# Patient Record
Sex: Female | Born: 1970 | Hispanic: Yes | Smoking: Former smoker
Health system: Southern US, Community
[De-identification: ages and names within clinical notes are randomized; demographics above are authoritative.]

## PROBLEM LIST (undated history)

## (undated) DIAGNOSIS — A048 Other specified bacterial intestinal infections: Secondary | ICD-10-CM

## (undated) DIAGNOSIS — J45909 Unspecified asthma, uncomplicated: Secondary | ICD-10-CM

## (undated) DIAGNOSIS — E079 Disorder of thyroid, unspecified: Secondary | ICD-10-CM

## (undated) HISTORY — PX: ADENOIDECTOMY: SUR15

## (undated) HISTORY — PX: TONSILLECTOMY: SUR1361

## (undated) HISTORY — DX: Unspecified asthma, uncomplicated: J45.909

---

## 2011-06-04 DIAGNOSIS — K589 Irritable bowel syndrome without diarrhea: Secondary | ICD-10-CM | POA: Insufficient documentation

## 2011-06-04 DIAGNOSIS — G4700 Insomnia, unspecified: Secondary | ICD-10-CM | POA: Insufficient documentation

## 2012-01-04 DIAGNOSIS — M5126 Other intervertebral disc displacement, lumbar region: Secondary | ICD-10-CM | POA: Insufficient documentation

## 2013-06-25 DIAGNOSIS — F32A Depression, unspecified: Secondary | ICD-10-CM | POA: Insufficient documentation

## 2014-01-12 DIAGNOSIS — F419 Anxiety disorder, unspecified: Secondary | ICD-10-CM | POA: Insufficient documentation

## 2015-12-14 DIAGNOSIS — R922 Inconclusive mammogram: Secondary | ICD-10-CM | POA: Insufficient documentation

## 2016-02-02 DIAGNOSIS — Z9189 Other specified personal risk factors, not elsewhere classified: Secondary | ICD-10-CM | POA: Insufficient documentation

## 2016-02-06 DIAGNOSIS — Z1379 Encounter for other screening for genetic and chromosomal anomalies: Secondary | ICD-10-CM | POA: Insufficient documentation

## 2016-06-18 DIAGNOSIS — N6092 Unspecified benign mammary dysplasia of left breast: Secondary | ICD-10-CM | POA: Insufficient documentation

## 2020-07-24 ENCOUNTER — Ambulatory Visit (HOSPITAL_COMMUNITY)
Admission: EM | Admit: 2020-07-24 | Discharge: 2020-07-24 | Disposition: A | Discharge status: Home / Self Care | Payer: No Typology Code available for payment source | Attending: Student | Admitting: Student

## 2020-07-24 ENCOUNTER — Encounter (HOSPITAL_COMMUNITY): Payer: Self-pay

## 2020-07-24 DIAGNOSIS — A048 Other specified bacterial intestinal infections: Secondary | ICD-10-CM

## 2020-07-24 DIAGNOSIS — R109 Unspecified abdominal pain: Secondary | ICD-10-CM

## 2020-07-24 LAB — POCT URINALYSIS DIPSTICK, ED / UC
Bilirubin Urine: NEGATIVE
Glucose, UA: NEGATIVE mg/dL
Hgb urine dipstick: NEGATIVE
Ketones, ur: NEGATIVE mg/dL
Leukocytes,Ua: NEGATIVE
Nitrite: NEGATIVE
Protein, ur: NEGATIVE mg/dL
Specific Gravity, Urine: 1.02 (ref 1.005–1.030)
Urobilinogen, UA: 0.2 mg/dL (ref 0.0–1.0)
pH: 6 (ref 5.0–8.0)

## 2020-07-24 NOTE — ED Triage Notes (Signed)
Pt present abdominal pain with brown urine. Symptoms started two weeks ago. Pt states her lab work from her PCP are abnormal. Pt states her potassium, glucose and Albumin  Levels are increased.

## 2020-07-24 NOTE — Discharge Instructions (Addendum)
-  The amount of abdominal pain you are having is unusual and extreme.  I am concerned that you are having an issue with your internal organs like internal bleeding.  Please head straight to [facility] emergency department for further evaluation and management of this pain.  If you experience worsening symptoms on the way or new symptoms like dizziness, chest pain, shortness of breath-stop and call 911 immediately.

## 2020-07-24 NOTE — ED Triage Notes (Signed)
Placed Pulse Ox on pt in lobby to evaluate HR.

## 2020-07-24 NOTE — ED Provider Notes (Signed)
[facility]    CSN: [id number] Arrival date & time: 07/24/20  1547      History   Chief Complaint Chief Complaint  Patient presents with  . Abdominal Pain  . Urinary Frequency    HPI Stefanie Glass is a 50 y.o. female presenting with abdominal pain.  Medical history atherosclerotic heart disease, IBS.  Self-reported history of H. pylori that she was recently treated for with antibiotic regimen.  This patient initially told nurse that she is having brown urine and abdominal pain for 2 weeks.  However, when I entered the room she told me that she is not having urinary symptoms, but is having diarrhea and left upper quadrant pain. Pain does not radiate to back. Last BM was today and was diarrhea.  She is concerned because she states that her potassium, glucose, albumin levels checked by primary care 1 week ago are abnormal.  States she was recently treated for H. pylori with antibiotic regimen and she is concerned that this has not resolved her symptoms.  States she has diarrhea that leaves yellow residue when she flushes the toilet.  Denies URI symptoms, fever/chills, hematemesis, melena, hematochezia. Denies hematuria, dysuria, frequency, urgency, back pain, n/v/d/abd pain, fevers/chills, abdnormal vaginal discharge. She is postmenopausal. I do not have records of recent PCP visit and labwork.  HPI  History reviewed. No pertinent past medical history.  There are no problems to display for this patient.    OB History   No obstetric history on file.      Home Medications    Prior to Admission medications   Not on File    Family History History reviewed. No pertinent family history.  Social History     Allergies   Sulfa antibiotics   Review of Systems Review of Systems  Constitutional: Negative for appetite change, chills, diaphoresis, fever and unexpected weight change.  HENT: Negative for congestion, ear pain, sinus pressure, sinus pain, sneezing, sore  throat and trouble swallowing.   Respiratory: Negative for cough, chest tightness and shortness of breath.   Cardiovascular: Negative for chest pain.  Gastrointestinal: Positive for abdominal pain and diarrhea. Negative for abdominal distention, anal bleeding, blood in stool, constipation, nausea, rectal pain and vomiting.  Genitourinary: Negative for dysuria, flank pain, frequency and urgency.  Musculoskeletal: Negative for back pain and myalgias.  Neurological: Negative for dizziness, light-headedness and headaches.  All other systems reviewed and are negative.    Physical Exam Triage Vital Signs ED Triage Vitals  Enc Vitals Group     BP 07/24/20 1649 103/61     Pulse Rate 07/24/20 1554 80     Resp --      Temp 07/24/20 1649 97.8 F (36.6 C)     Temp Source 07/24/20 1649 Oral     SpO2 07/24/20 1554 98 %     Weight --      Height --      Head Circumference --      Peak Flow --      Pain Score 07/24/20 1649 5     Pain Loc --      Pain Edu? --      Excl. in GC? --    No data found.  Updated Vital Signs BP 103/61 (BP Location: Right Arm)   Pulse 71   Temp 97.8 F (36.6 C) (Oral)   SpO2 100%   Visual Acuity Right Eye Distance:   Left Eye Distance:   Bilateral Distance:    Right Eye  Near:   Left Eye Near:    Bilateral Near:     Physical Exam Vitals reviewed.  Constitutional:      General: She is not in acute distress.    Appearance: Normal appearance. She is not ill-appearing.     Comments: Resting comfortably Walking throughout clinic in no apparent distress  HENT:     Head: Normocephalic and atraumatic.     Mouth/Throat:     Mouth: Mucous membranes are moist.     Comments: Moist mucous membranes Eyes:     Extraocular Movements: Extraocular movements intact.     Pupils: Pupils are equal, round, and reactive to light.  Cardiovascular:     Rate and Rhythm: Normal rate and regular rhythm.     Heart sounds: Normal heart sounds.  Pulmonary:     Effort:  Pulmonary effort is normal.     Breath sounds: Normal breath sounds. No wheezing, rhonchi or rales.  Abdominal:     General: Bowel sounds are normal. There is no distension.     Palpations: Abdomen is soft. There is no mass.     Tenderness: There is abdominal tenderness in the left upper quadrant. There is no right CVA tenderness, left CVA tenderness, guarding or rebound.     Comments: Patient screaming in agony with palpation LUQ.   Skin:    General: Skin is warm.     Capillary Refill: Capillary refill takes less than 2 seconds.     Comments: Good skin turgor  Neurological:     General: No focal deficit present.     Mental Status: She is alert and oriented to person, place, and time.  Psychiatric:        Mood and Affect: Mood normal.        Behavior: Behavior normal.      UC Treatments / Results  Labs (all labs ordered are listed, but only abnormal results are displayed) Labs Reviewed  POCT URINALYSIS DIPSTICK, ED / UC    EKG   Radiology No results found.  Procedures Procedures (including critical care time)  Medications Ordered in UC Medications - No data to display  Initial Impression / Assessment and Plan / UC Course  I have reviewed the triage vital signs and the nursing notes.  Pertinent labs & imaging results that were available during my care of the patient were reviewed by me and considered in my medical decision making (see chart for details).     This patient is a 50 year old female complaining of significant abdominal pain following completion of antibiotic course for H. pylori infection.  I do not have records of this recent treatment course.  On exam, patient screams in agony with palpation of left upper quadrant.  She is however resting comfortably and ambulating throughout clinic without issue.  Given significant abdominal pain following antibiotic regimen, I am recommending she head straight to [facility] emergency department for further evaluation and  management.  She may require abdominal imaging and stool testing to rule out C. difficile.  She verbalizes understanding and agreement.  Final Clinical Impressions(s) / UC Diagnoses   Final diagnoses:  Sudden onset of severe abdominal pain  H. pylori infection     Discharge Instructions     -The amount of abdominal pain you are having is unusual and extreme.  I am concerned that you are having an issue with your internal organs like internal bleeding.  Please head straight to [facility] emergency department for further evaluation and management of  this pain.  If you experience worsening symptoms on the way or new symptoms like dizziness, chest pain, shortness of breath-stop and call 911 immediately.    ED Prescriptions    None     PDMP not reviewed this encounter.   Rhys Martini, PA-C 07/24/20 [place] Fruit

## 2020-07-24 NOTE — ED Notes (Signed)
Patient is being discharged from the Urgent Care and sent to the Emergency Department via POV . Per Ignacia Bayley PA, patient is in need of higher level of care due to severe abdominal pain. Patient is aware and verbalizes understanding of plan of care.  Vitals:   07/24/20 1554 07/24/20 1649  BP:  103/61  Pulse: 80 71  Temp:  97.8 F (36.6 C)  SpO2: 98% 100%

## 2020-07-28 ENCOUNTER — Emergency Department (HOSPITAL_COMMUNITY)
Admission: EM | Admit: 2020-07-28 | Discharge: 2020-07-28 | Disposition: A | Discharge status: Home / Self Care | Payer: No Typology Code available for payment source | Attending: Emergency Medicine | Admitting: Emergency Medicine

## 2020-07-28 ENCOUNTER — Emergency Department (HOSPITAL_COMMUNITY): Payer: No Typology Code available for payment source

## 2020-07-28 ENCOUNTER — Other Ambulatory Visit: Payer: Self-pay

## 2020-07-28 ENCOUNTER — Encounter (HOSPITAL_COMMUNITY): Payer: Self-pay

## 2020-07-28 DIAGNOSIS — R197 Diarrhea, unspecified: Secondary | ICD-10-CM | POA: Insufficient documentation

## 2020-07-28 DIAGNOSIS — R439 Unspecified disturbances of smell and taste: Secondary | ICD-10-CM | POA: Insufficient documentation

## 2020-07-28 DIAGNOSIS — R143 Flatulence: Secondary | ICD-10-CM | POA: Diagnosis not present

## 2020-07-28 DIAGNOSIS — R140 Abdominal distension (gaseous): Secondary | ICD-10-CM | POA: Insufficient documentation

## 2020-07-28 DIAGNOSIS — R1084 Generalized abdominal pain: Secondary | ICD-10-CM | POA: Diagnosis present

## 2020-07-28 DIAGNOSIS — R142 Eructation: Secondary | ICD-10-CM | POA: Diagnosis not present

## 2020-07-28 HISTORY — DX: Other specified bacterial intestinal infections: A04.8

## 2020-07-28 HISTORY — DX: Disorder of thyroid, unspecified: E07.9

## 2020-07-28 LAB — COMPREHENSIVE METABOLIC PANEL
ALT: 16 U/L (ref 0–44)
AST: 20 U/L (ref 15–41)
Albumin: 4 g/dL (ref 3.5–5.0)
Alkaline Phosphatase: 28 U/L — ABNORMAL LOW (ref 38–126)
Anion gap: 4 — ABNORMAL LOW (ref 5–15)
BUN: 14 mg/dL (ref 6–20)
CO2: 29 mmol/L (ref 22–32)
Calcium: 10.2 mg/dL (ref 8.9–10.3)
Chloride: 102 mmol/L (ref 98–111)
Creatinine, Ser: 0.79 mg/dL (ref 0.44–1.00)
GFR, Estimated: >60 mL/min (ref >60)
Glucose, Bld: 106 mg/dL — ABNORMAL HIGH (ref 70–99)
Potassium: 4.8 mmol/L (ref 3.5–5.1)
Sodium: 135 mmol/L (ref 135–145)
Total Bilirubin: 0.6 mg/dL (ref 0.3–1.2)
Total Protein: 6.4 g/dL — ABNORMAL LOW (ref 6.5–8.1)

## 2020-07-28 LAB — LIPASE, BLOOD: Lipase: 30 U/L (ref 11–51)

## 2020-07-28 LAB — URINALYSIS, ROUTINE W REFLEX MICROSCOPIC
Bilirubin Urine: NEGATIVE
Glucose, UA: NEGATIVE mg/dL
Hgb urine dipstick: NEGATIVE
Ketones, ur: NEGATIVE mg/dL
Leukocytes,Ua: NEGATIVE
Nitrite: NEGATIVE
Protein, ur: NEGATIVE mg/dL
Specific Gravity, Urine: 1.02 (ref 1.005–1.030)
pH: 8 (ref 5.0–8.0)

## 2020-07-28 LAB — CBC
HCT: 40.5 % (ref 36.0–46.0)
Hemoglobin: 13.2 g/dL (ref 12.0–15.0)
MCH: 29.7 pg (ref 26.0–34.0)
MCHC: 32.6 g/dL (ref 30.0–36.0)
MCV: 91.2 fL (ref 80.0–100.0)
Platelets: 250 10*3/uL (ref 150–400)
RBC: 4.44 MIL/uL (ref 3.87–5.11)
RDW: 12.5 % (ref 11.5–15.5)
WBC: 6.9 10*3/uL (ref 4.0–10.5)
nRBC: 0 % (ref 0.0–0.2)

## 2020-07-28 MED ORDER — SODIUM CHLORIDE 0.9 % IV BOLUS
1000.0000 mL | Freq: Once | INTRAVENOUS | Status: AC
  Administered 2020-07-28: 1000 mL via INTRAVENOUS

## 2020-07-28 MED ORDER — IOHEXOL 9 MG/ML PO SOLN: 500.0000 mL | ORAL | Status: AC

## 2020-07-28 MED ORDER — IOHEXOL 9 MG/ML PO SOLN
ORAL | Status: AC
  Administered 2020-07-28: 1000 mL
  Filled 2020-07-28: qty 1000

## 2020-07-28 NOTE — ED Notes (Signed)
PA-C at the bedside to evaluate.  

## 2020-07-28 NOTE — ED Provider Notes (Signed)
[facility] Provider Note   CSN: [id number] Arrival date & time: 07/28/20  1105     History Chief Complaint  Patient presents with  . Abdominal Pain  . Diarrhea    Stefanie Glass is a 50 y.o. female.  Patient with no surgical history presents the emergency department today for evaluation of abdominal pain.  Patient states that her symptoms started around May 8th.  She states that she went to her provider and requested a H. pylori breath test which was positive.  She was treated with quadruple therapy and some of her symptoms resolved however she continued to have bloating, belching, diarrhea and flatulence.  She has had pain and has been worse in the upper quadrants.  Not changed much with food.  Stools have been soft to liquid in consistency.  They have been a dark brown color without black or blood noted.  She has a constant bitter taste in her mouth.  She states that she had labs drawn which showed some abnormalities including an elevated potassium.  She denies vomiting or fever.  She went to an urgent care on 5/29 and they recommended that she come to the emergency department because she had severe pain, per their note, during visit.  She states that she could not because she did not want to leave her daughter by herself.  She states that she had a virtual visit with her provider yesterday, who ordered a CT scan, however she had a "terrible night" last night and felt the need to come in for evaluation today. The onset of this condition was acute. The course is constant. Aggravating factors: none. Alleviating factors: none.           Past Medical History:  Diagnosis Date  . H. pylori infection   . Thyroid disease     There are no problems to display for this patient.   History reviewed. No pertinent surgical history.   OB History   No obstetric history on file.     Family History  Problem Relation Age of Onset  . Cancer Mother   .  Diabetes Mother   . Cancer Father   . Dementia Father     Social History   Tobacco Use  . Smoking status: Never Smoker  . Smokeless tobacco: Never Used  Vaping Use  . Vaping Use: Never used  Substance Use Topics  . Alcohol use: Yes  . Drug use: Never    Home Medications Prior to Admission medications   Not on File    Allergies    Sulfa antibiotics  Review of Systems   Review of Systems  Constitutional: Negative for fever.  HENT: Negative for rhinorrhea and sore throat.   Eyes: Negative for redness.  Respiratory: Negative for cough and shortness of breath.   Cardiovascular: Negative for chest pain.  Gastrointestinal: Positive for abdominal pain, diarrhea and nausea. Negative for blood in stool and vomiting.  Genitourinary: Negative for dysuria, frequency, hematuria and urgency.  Musculoskeletal: Negative for myalgias.  Skin: Negative for rash.  Neurological: Positive for headaches (occasional).    Physical Exam Updated Vital Signs BP 131/79 (BP Location: Left Arm)   Temp 98.3 F (36.8 C) (Oral)   Resp 14   Ht 5\' 4"  (1.626 m)   Wt 59 kg   SpO2 98%   BMI 22.31 kg/m   Physical Exam Vitals and nursing note reviewed.  Constitutional:      General: She is not in acute distress.  Appearance: She is well-developed.  HENT:     Head: Normocephalic and atraumatic.     Right Ear: External ear normal.     Left Ear: External ear normal.     Nose: Nose normal.  Eyes:     Conjunctiva/sclera: Conjunctivae normal.  Cardiovascular:     Rate and Rhythm: Normal rate and regular rhythm.     Heart sounds: No murmur heard.   Pulmonary:     Effort: No respiratory distress.     Breath sounds: No wheezing, rhonchi or rales.  Abdominal:     Palpations: Abdomen is soft.     Tenderness: There is abdominal tenderness (mild, generalized, more so lower bilateral and LUQ). There is no guarding or rebound.  Musculoskeletal:     Cervical back: Normal range of motion and neck  supple.     Right lower leg: No edema.     Left lower leg: No edema.  Skin:    General: Skin is warm and dry.     Findings: No rash.  Neurological:     General: No focal deficit present.     Mental Status: She is alert. Mental status is at baseline.     Motor: No weakness.  Psychiatric:        Mood and Affect: Mood normal.     ED Results / Procedures / Treatments   Labs (all labs ordered are listed, but only abnormal results are displayed) Labs Reviewed  COMPREHENSIVE METABOLIC PANEL - Abnormal; Notable for the following components:      Result Value   Glucose, Bld 106 (*)    Total Protein 6.4 (*)    Alkaline Phosphatase 28 (*)    Anion gap 4 (*)    All other components within normal limits  LIPASE, BLOOD  CBC  URINALYSIS, ROUTINE W REFLEX MICROSCOPIC    EKG None  Radiology CT ABDOMEN PELVIS WO CONTRAST  Result Date: 07/28/2020 CLINICAL DATA:  Worsening abdominal pain for 3 weeks. EXAM: CT ABDOMEN AND PELVIS WITHOUT CONTRAST TECHNIQUE: Multidetector CT imaging of the abdomen and pelvis was performed following the standard protocol without IV contrast. COMPARISON:  None. FINDINGS: Lower chest: No acute abnormality. Hepatobiliary: No focal hepatic abnormality. Gallbladder unremarkable. Pancreas: No focal abnormality or ductal dilatation. Spleen: No focal abnormality.  Normal size. Adrenals/Urinary Tract: No adrenal abnormality. No focal renal abnormality. No stones or hydronephrosis. Urinary bladder is unremarkable. Stomach/Bowel: Normal appendix. Stomach, large and small bowel grossly unremarkable. Vascular/Lymphatic: Scattered aortic atherosclerosis. No evidence of aneurysm or adenopathy. Reproductive: Uterus and adnexa unremarkable.  No mass. Other: No free fluid or free air. Musculoskeletal: No acute bony abnormality. IMPRESSION: No acute findings in the abdomen or pelvis. Scattered aortic atherosclerosis. Electronically Signed   By: Charlett Nose M.D.   On: 07/28/2020 17:42     Procedures Procedures   Medications Ordered in ED Medications  iohexol (OMNIPAQUE) 9 MG/ML oral solution 500 mL (has no administration in time range)  sodium chloride 0.9 % bolus 1,000 mL (1,000 mLs Intravenous New Bag/Given 07/28/20 1229)  iohexol (OMNIPAQUE) 9 MG/ML oral solution (1,000 mLs  Contrast Given 07/28/20 1435)    ED Course  I have reviewed the triage vital signs and the nursing notes.  Pertinent labs & imaging results that were available during my care of the patient were reviewed by me and considered in my medical decision making (see chart for details).  Patient seen and examined. Work-up initiated. Fluids ordered. Reviewed previous urgent care note.   Vital signs  reviewed and are as follows: BP 131/79 (BP Location: Left Arm)   Temp 98.3 F (36.8 C) (Oral)   Resp 14   Ht 5\' 4"  (1.626 m)   Wt 59 kg   SpO2 98%   BMI 22.31 kg/m   Discussed lab results with patient.  We had a shared decision making discussion on whether or not to obtain abdominal imaging today.  Patient states that her PCP has ordered a CT scan for early next week.  We discussed risks and benefits.  Given that her symptoms have been worsening recently, and ongoing for several weeks, I feel that it is reasonable to go ahead and obtain imaging today and patient would prefer this.  5:58 PM CT reassuring.  Patient is stable and asymptomatic at this time.  Plan for discharge to home.  GI referral given.  The patient was urged to return to the Emergency Department immediately with worsening of current symptoms, worsening abdominal pain, persistent vomiting, blood noted in stools, fever, or any other concerns. The patient verbalized understanding.      MDM Rules/Calculators/A&P                          Patient with abdominal pain, recent treatment for peptic ulcer disease without improvement, symptoms worsening.. Vitals are stable, no fever. Labs reassuring. Imaging CT negative. No signs of dehydration,  patient is tolerating PO's. Lungs are clear and no signs suggestive of PNA. Low concern for appendicitis, cholecystitis, pancreatitis, ruptured viscus, UTI, kidney stone, aortic dissection, aortic aneurysm or other emergent abdominal etiology. Supportive therapy indicated with return if symptoms worsen.    Final Clinical Impression(s) / ED Diagnoses Final diagnoses:  Generalized abdominal pain    Rx / DC Orders ED Discharge Orders    None       , PA-C 07/28/20 09/27/20, MD 07/29/20 [phone]

## 2020-07-28 NOTE — ED Triage Notes (Addendum)
Patient c/o generalized abdominal pain and intermittent diarrhea x 1 month.  patient also adds that she has a lot of gas, bitter taste,headache x 1 month.

## 2020-07-28 NOTE — Discharge Instructions (Signed)
Please read and follow all provided instructions.  Your diagnoses today include:  1. Generalized abdominal pain     Tests performed today include:  Blood cell counts and platelets  Kidney and liver function tests  Pancreas function test (called lipase)  Urine test to look for infection  A blood or urine test for pregnancy (women only)   CT scan - no acute of concerning findings  Vital signs. See below for your results today.   Medications prescribed:   None  Take any prescribed medications only as directed.  Home care instructions:   Follow any educational materials contained in this packet.  Follow-up instructions: Please follow-up with the GI referral listed.    Return instructions:  SEEK IMMEDIATE MEDICAL ATTENTION IF:  The pain does not go away or becomes severe   A temperature above 101F develops   Repeated vomiting occurs (multiple episodes)   The pain becomes localized to portions of the abdomen. The right side could possibly be appendicitis. In an adult, the left lower portion of the abdomen could be colitis or diverticulitis.   Blood is being passed in stools or vomit (bright red or black tarry stools)   You develop chest pain, difficulty breathing, dizziness or fainting, or become confused, poorly responsive, or inconsolable (young children)  If you have any other emergent concerns regarding your health  Additional Information: Abdominal (belly) pain can be caused by many things. Your caregiver performed an examination and possibly ordered blood/urine tests and imaging (CT scan, x-rays, ultrasound). Many cases can be observed and treated at home after initial evaluation in the emergency department. Even though you are being discharged home, abdominal pain can be unpredictable. Therefore, you need a repeated exam if your pain does not resolve, returns, or worsens. Most patients with abdominal pain don't have to be admitted to the hospital or have surgery,  but serious problems like appendicitis and gallbladder attacks can start out as nonspecific pain. Many abdominal conditions cannot be diagnosed in one visit, so follow-up evaluations are very important.  Your vital signs today were: BP 106/69   Pulse 76   Temp 98.3 F (36.8 C) (Oral)   Resp 16   Ht 5\' 4"  (1.626 m)   Wt 59 kg   SpO2 100%   BMI 22.31 kg/m  If your blood pressure (bp) was elevated above 135/85 this visit, please have this repeated by your doctor within one month. --------------

## 2020-07-29 ENCOUNTER — Encounter: Payer: Self-pay | Admitting: Nurse Practitioner

## 2020-08-08 DIAGNOSIS — E039 Hypothyroidism, unspecified: Secondary | ICD-10-CM | POA: Insufficient documentation

## 2020-08-08 DIAGNOSIS — I341 Nonrheumatic mitral (valve) prolapse: Secondary | ICD-10-CM | POA: Insufficient documentation

## 2020-08-15 ENCOUNTER — Other Ambulatory Visit: Payer: Self-pay

## 2020-08-30 ENCOUNTER — Ambulatory Visit: Payer: No Typology Code available for payment source | Admitting: Nurse Practitioner

## 2021-02-11 ENCOUNTER — Encounter (HOSPITAL_COMMUNITY): Payer: Self-pay

## 2021-02-11 ENCOUNTER — Emergency Department (HOSPITAL_COMMUNITY): Payer: No Typology Code available for payment source

## 2021-02-11 ENCOUNTER — Emergency Department (HOSPITAL_COMMUNITY)
Admission: EM | Admit: 2021-02-11 | Discharge: 2021-02-12 | Disposition: A | Discharge status: Home / Self Care | Payer: No Typology Code available for payment source | Attending: Emergency Medicine | Admitting: Emergency Medicine

## 2021-02-11 DIAGNOSIS — N9489 Other specified conditions associated with female genital organs and menstrual cycle: Secondary | ICD-10-CM | POA: Diagnosis not present

## 2021-02-11 DIAGNOSIS — E039 Hypothyroidism, unspecified: Secondary | ICD-10-CM | POA: Insufficient documentation

## 2021-02-11 DIAGNOSIS — A0472 Enterocolitis due to Clostridium difficile, not specified as recurrent: Secondary | ICD-10-CM | POA: Insufficient documentation

## 2021-02-11 DIAGNOSIS — Z79899 Other long term (current) drug therapy: Secondary | ICD-10-CM | POA: Diagnosis not present

## 2021-02-11 DIAGNOSIS — R109 Unspecified abdominal pain: Secondary | ICD-10-CM | POA: Diagnosis present

## 2021-02-11 LAB — COMPREHENSIVE METABOLIC PANEL
ALT: 21 U/L (ref 0–44)
AST: 23 U/L (ref 15–41)
Albumin: 4.2 g/dL (ref 3.5–5.0)
Alkaline Phosphatase: 37 U/L — ABNORMAL LOW (ref 38–126)
Anion gap: 9 (ref 5–15)
BUN: 20 mg/dL (ref 6–20)
CO2: 26 mmol/L (ref 22–32)
Calcium: 9.5 mg/dL (ref 8.9–10.3)
Chloride: 102 mmol/L (ref 98–111)
Creatinine, Ser: 0.76 mg/dL (ref 0.44–1.00)
GFR, Estimated: >60 mL/min (ref >60)
Glucose, Bld: 92 mg/dL (ref 70–99)
Potassium: 4 mmol/L (ref 3.5–5.1)
Sodium: 137 mmol/L (ref 135–145)
Total Bilirubin: 0.6 mg/dL (ref 0.3–1.2)
Total Protein: 7.2 g/dL (ref 6.5–8.1)

## 2021-02-11 LAB — CBC WITH DIFFERENTIAL/PLATELET
Abs Immature Granulocytes: 0.02 10*3/uL (ref 0.00–0.07)
Basophils Absolute: 0.1 10*3/uL (ref 0.0–0.1)
Basophils Relative: 1 %
Eosinophils Absolute: 0.1 10*3/uL (ref 0.0–0.5)
Eosinophils Relative: 1 %
HCT: 41.2 % (ref 36.0–46.0)
Hemoglobin: 13.3 g/dL (ref 12.0–15.0)
Immature Granulocytes: 0 %
Lymphocytes Relative: 44 %
Lymphs Abs: 2.7 10*3/uL (ref 0.7–4.0)
MCH: 28.5 pg (ref 26.0–34.0)
MCHC: 32.3 g/dL (ref 30.0–36.0)
MCV: 88.4 fL (ref 80.0–100.0)
Monocytes Absolute: 0.4 10*3/uL (ref 0.1–1.0)
Monocytes Relative: 7 %
Neutro Abs: 2.8 10*3/uL (ref 1.7–7.7)
Neutrophils Relative %: 47 %
Platelet Morphology: NORMAL
Platelets: 271 10*3/uL (ref 150–400)
RBC: 4.66 MIL/uL (ref 3.87–5.11)
RDW: 12.8 % (ref 11.5–15.5)
WBC: 6 10*3/uL (ref 4.0–10.5)
nRBC: 0 % (ref 0.0–0.2)

## 2021-02-11 LAB — URINALYSIS, ROUTINE W REFLEX MICROSCOPIC
Bilirubin Urine: NEGATIVE
Glucose, UA: NEGATIVE mg/dL
Hgb urine dipstick: NEGATIVE
Ketones, ur: NEGATIVE mg/dL
Leukocytes,Ua: NEGATIVE
Nitrite: NEGATIVE
Protein, ur: NEGATIVE mg/dL
Specific Gravity, Urine: 1.003 — ABNORMAL LOW (ref 1.005–1.030)
pH: 6 (ref 5.0–8.0)

## 2021-02-11 LAB — I-STAT BETA HCG BLOOD, ED (MC, WL, AP ONLY): I-stat hCG, quantitative: 5 m[IU]/mL — ABNORMAL HIGH (ref <5)

## 2021-02-11 LAB — LIPASE, BLOOD: Lipase: 35 U/L (ref 11–51)

## 2021-02-11 MED ORDER — IOHEXOL 350 MG/ML SOLN
80.0000 mL | Freq: Once | INTRAVENOUS | Status: AC | PRN
  Administered 2021-02-11: 80 mL via INTRAVENOUS

## 2021-02-11 NOTE — ED Triage Notes (Signed)
Pt reports severe diarrhea for the past several days. Pt reports she just found out she tested positive for c-diff. C/o lower abdominal pain and is now experiencing sob.

## 2021-02-11 NOTE — ED Provider Notes (Signed)
Emergency Medicine Provider Triage Evaluation Note  Stefanie Glass , a 50 y.o. female  was evaluated in triage.  Pt complains of abd pain, headache weakness dehydration. Recently dx with cdif.  Review of Systems  Positive: Abd pain, ha, weakness, dehydration Negative: vomiting  Physical Exam  BP (!) 138/94    Pulse 62    Temp 98.3 F (36.8 C)    Resp 19    SpO2 100%  Gen:   Awake, no distress   Resp:  Normal effort  MSK:   Moves extremities without difficulty   Medical Decision Making  Medically screening exam initiated at 4:33 PM.  Appropriate orders placed.  Stefanie Glass was informed that the remainder of the evaluation will be completed by another provider, this initial triage assessment does not replace that evaluation, and the importance of remaining in the ED until their evaluation is complete.     Stefanie Glass 02/11/21 1634    Stefanie Munch, MD 02/11/21 [phone]

## 2021-02-12 MED ORDER — VANCOMYCIN HCL 125 MG PO CAPS: 125.0000 mg | ORAL_CAPSULE | Freq: Four times a day (QID) | ORAL | 0 refills | Status: DC

## 2021-02-12 MED ORDER — FIDAXOMICIN 200 MG PO TABS: 200.0000 mg | ORAL_TABLET | Freq: Two times a day (BID) | ORAL | 0 refills | Status: DC

## 2021-02-12 NOTE — Discharge Instructions (Signed)
Begin taking Fidaxomycin as prescribed.  If this medication is prohibitively expensive or unavailable, I have also given a prescription for vancomycin for you to fill and begin taking instead.  Follow-up with primary doctor in the next week, and return to the ER if you develop severe abdominal pain, bloody stools, or other new and concerning symptoms.

## 2021-02-12 NOTE — ED Provider Notes (Signed)
[facility]-EMERGENCY DEPT Provider Note   CSN: [id number] Arrival date & time: 02/11/21  1620     History Chief Complaint  Patient presents with   C diff   Diarrhea   Abdominal Pain    Stefanie Glass is a 50 y.o. female.  Patient is a 50 year old female with history of H. pylori, irritable bowel.  Patient presenting today for evaluation of diarrhea.  This has been worsening over the past several days and was preceded by a course of amoxicillin for sinusitis.  She was seen at the doctor's office yesterday and had a stool sample sent which returned positive for C. difficile.  She presents here with ongoing diarrhea and weakness.  She denies any fevers or chills.  She reports some abdominal cramping, but denies localized pain.  She denies any bloody stool or vomit.  The history is provided by the patient.      Past Medical History:  Diagnosis Date   H. pylori infection    Thyroid disease     Patient Active Problem List   Diagnosis Date Noted   Hypothyroidism 08/08/2020   Mitral valve prolapse 08/08/2020   Atypical lobular hyperplasia [facility]) of left breast 06/18/2016   Genetic testing 02/06/2016   At high risk for breast cancer 02/02/2016   Dense breast tissue on mammogram 12/14/2015   Anxiety 01/12/2014   Depression 06/25/2013   Protruded lumbar disc 01/04/2012   IBS (irritable bowel syndrome) 06/04/2011   Insomnia 06/04/2011    History reviewed. No pertinent surgical history.   OB History   No obstetric history on file.     Family History  Problem Relation Age of Onset   Cancer Mother    Diabetes Mother    Cancer Father    Dementia Father     Social History   Tobacco Use   Smoking status: Never   Smokeless tobacco: Never  Vaping Use   Vaping Use: Never used  Substance Use Topics   Alcohol use: Not Currently   Drug use: Never    Home Medications Prior to Admission medications   Medication Sig Start Date End Date Taking?  Authorizing Provider  dicyclomine (BENTYL) 10 MG capsule Take 20 mg by mouth 3 (three) times daily as needed for spasms. 07/28/20   [provider]  esomeprazole (NEXIUM) 20 MG capsule Take 20 mg by mouth daily. 06/27/20   [provider]  etonogestrel (NEXPLANON) 68 MG IMPL implant Inject into the skin. 11/08/15   [provider]  fluticasone (FLONASE) 50 MCG/ACT nasal spray Place 1 spray into both nostrils daily.    [provider]  ketorolac (TORADOL) 10 MG tablet Take 10 mg by mouth every 8 (eight) hours as needed for moderate pain. 06/26/18   [provider]  levothyroxine (SYNTHROID) 100 MCG tablet Take 100 mcg by mouth daily before breakfast.    [provider]  metoCLOPramide (REGLAN) 10 MG tablet Take 10 mg by mouth every 8 (eight) hours as needed for nausea or vomiting. 04/11/20   [provider]  Multiple Vitamin (THERA) TABS Take 1 tablet by mouth daily.    [provider]  phentermine (ADIPEX-P) 37.5 MG tablet Take 37.5 mg by mouth every morning. 06/24/20   [provider]  spironolactone (ALDACTONE) 100 MG tablet Take 100 mg by mouth daily. 07/10/20   [provider]  sucralfate (CARAFATE) 1 g tablet Take by mouth. 07/10/20   [provider]    Allergies  Sulfa antibiotics  Review of Systems   Review of Systems  All other systems reviewed and are negative.  Physical Exam Updated Vital Signs BP 102/79    Pulse 68    Temp 98.7 F (37.1 C)    Resp 16    SpO2 100%   Physical Exam Vitals and nursing note reviewed.  Constitutional:      General: She is not in acute distress.    Appearance: She is well-developed. She is not diaphoretic.  HENT:     Head: Normocephalic and atraumatic.  Cardiovascular:     Rate and Rhythm: Normal rate and regular rhythm.     Heart sounds: No murmur heard.   No friction rub. No gallop.  Pulmonary:     Effort: Pulmonary effort is normal. No respiratory  distress.     Breath sounds: Normal breath sounds. No wheezing.  Abdominal:     General: Bowel sounds are normal. There is no distension.     Palpations: Abdomen is soft.     Tenderness: There is no abdominal tenderness. There is no right CVA tenderness or left CVA tenderness.  Musculoskeletal:        General: Normal range of motion.     Cervical back: Normal range of motion and neck supple.  Skin:    General: Skin is warm and dry.  Neurological:     General: No focal deficit present.     Mental Status: She is alert and oriented to person, place, and time.    ED Results / Procedures / Treatments   Labs (all labs ordered are listed, but only abnormal results are displayed) Labs Reviewed  COMPREHENSIVE METABOLIC PANEL - Abnormal; Notable for the following components:      Result Value   Alkaline Phosphatase 37 (*)    All other components within normal limits  URINALYSIS, ROUTINE W REFLEX MICROSCOPIC - Abnormal; Notable for the following components:   Color, Urine COLORLESS (*)    Specific Gravity, Urine 1.003 (*)    All other components within normal limits  I-STAT BETA HCG BLOOD, ED ([facility], [facility], [facility] ONLY) - Abnormal; Notable for the following components:   I-stat hCG, quantitative 5.0 (*)    All other components within normal limits  CBC WITH DIFFERENTIAL/PLATELET  LIPASE, BLOOD    EKG None  Radiology CT ABDOMEN PELVIS W CONTRAST  Result Date: 02/11/2021 CLINICAL DATA:  Acute abdominal pain, C difficile positivity, initial encounter EXAM: CT ABDOMEN AND PELVIS WITH CONTRAST TECHNIQUE: Multidetector CT imaging of the abdomen and pelvis was performed using the standard protocol following bolus administration of intravenous contrast. CONTRAST:  78mL OMNIPAQUE IOHEXOL 350 MG/ML SOLN COMPARISON:  07/28/2020 FINDINGS: Lower chest: No acute abnormality. Hepatobiliary: No focal liver abnormality is seen. No gallstones, gallbladder wall thickening, or biliary dilatation. Pancreas:  Unremarkable. No pancreatic ductal dilatation or surrounding inflammatory changes. Spleen: Normal in size without focal abnormality. Adrenals/Urinary Tract: Adrenal glands are within normal limits. Kidneys demonstrate a normal enhancement pattern bilaterally. No definitive calculi are seen normal excretion is noted on delayed images. No obstructive changes are noted. The bladder is partially distended. Stomach/Bowel: Scattered diverticular change of the colon is noted without evidence of diverticulitis. No significant wall thickening or inflammatory changes of the colon are noted. The appendix is within normal limits. No inflammatory changes are seen. Small bowel and stomach appear unremarkable. Vascular/Lymphatic: Aortic atherosclerosis. No enlarged abdominal or pelvic lymph nodes. Reproductive: Uterus and bilateral adnexa are unremarkable. Other: No abdominal wall hernia or abnormality.  No abdominopelvic ascites. Musculoskeletal: No acute or significant osseous findings. IMPRESSION: Diverticulosis without diverticulitis. No significant inflammatory changes of the colon are noted to correspond with the given clinical history of C difficile positivity. No other focal abnormality is noted. Electronically Signed   By: Alcide Clever M.D.   On: 02/11/2021 21:02    Procedures Procedures   Medications Ordered in ED Medications  iohexol (OMNIPAQUE) 350 MG/ML injection 80 mL (80 mLs Intravenous Contrast Given 02/11/21 2038)    ED Course  I have reviewed the triage vital signs and the nursing notes.  Pertinent labs & imaging results that were available during my care of the patient were reviewed by me and considered in my medical decision making (see chart for details).    MDM Rules/Calculators/A&P  Patient presenting here with complaints of diarrhea and positive C. difficile testing in the outpatient setting.  Her abdomen is benign and CT scan is unremarkable.  Laboratory studies are reassuring.  Patient  appears clinically well-hydrated and vital signs do not reflect dehydration.  At this point, I feel as though patient can be treated with antibiotics and return as needed.  She is requesting Fidaxomicin which I will prescribe.  As this is a new medication to me and I am uncertain as to whether or not we will be in stock and local pharmacies, I will also prescribe oral vancomycin as an alternative if the Fidaxomicin is unavailable or prohibitively expensive.  Final Clinical Impression(s) / ED Diagnoses Final diagnoses:  None    Rx / DC Orders ED Discharge Orders     None        Geoffery Lyons, MD 02/12/21 [phone]

## 2021-04-25 ENCOUNTER — Ambulatory Visit: Payer: No Typology Code available for payment source | Admitting: Cardiology

## 2021-04-25 ENCOUNTER — Ambulatory Visit (INDEPENDENT_AMBULATORY_CARE_PROVIDER_SITE_OTHER): Payer: No Typology Code available for payment source

## 2021-04-25 ENCOUNTER — Other Ambulatory Visit: Payer: Self-pay

## 2021-04-25 ENCOUNTER — Encounter: Payer: Self-pay | Admitting: Cardiology

## 2021-04-25 VITALS — BP 110/70 | HR 70 | Ht 63.0 in | Wt 134.0 lb

## 2021-04-25 DIAGNOSIS — R002 Palpitations: Secondary | ICD-10-CM

## 2021-04-25 DIAGNOSIS — Z8679 Personal history of other diseases of the circulatory system: Secondary | ICD-10-CM

## 2021-04-25 DIAGNOSIS — R079 Chest pain, unspecified: Secondary | ICD-10-CM

## 2021-04-25 NOTE — Patient Instructions (Signed)
Medication Instructions:  Your physician recommends that you continue on your current medications as directed. Please refer to the Current Medication list given to you today.  *If you need a refill on your cardiac medications before your next appointment, please call your pharmacy*   Lab Work: None ordered If you have labs (blood work) drawn today and your tests are completely normal, you will receive your results only by: MyChart Message (if you have MyChart) OR A paper copy in the mail If you have any lab test that is abnormal or we need to change your treatment, we will call you to review the results.   Testing/Procedures:   Your physician has requested that you have an echocardiogram. Echocardiography is a painless test that uses sound waves to create images of your heart. It provides your doctor with information about the size and shape of your heart and how well your hearts chambers and valves are working. This procedure takes approximately one hour. There are no restrictions for this procedure.  Your physician has recommended that you wear a Zio XT monitor for 2 weeks. This will be mailed to you    in 4-5 business days.   This monitor is a medical device that records the hearts electrical activity. Doctors most often use these monitors to diagnose arrhythmias. Arrhythmias are problems with the speed or rhythm of the heartbeat. The monitor is a small device applied to your chest. You can wear one while you do your normal daily activities. While wearing this monitor if you have any symptoms to push the button and record what you felt. Once you have worn this monitor for the period of time provider prescribed (Usually 14 days), you will return the monitor device in the postage paid box. Once it is returned they will download the data collected and provide [place] with a report which the provider will then review and we will call you with those results. Important tips:  Avoid showering during  the first 24 hours of wearing the monitor. Avoid excessive sweating to help maximize wear time. Do not submerge the device, no hot tubs, and no swimming pools. Keep any lotions or oils away from the patch. After 24 hours you may shower with the patch on. Take brief showers with your back facing the shower head.  Do not remove patch once it has been placed because that will interrupt data and decrease adhesive wear time. Push the button when you have any symptoms and write down what you were feeling. Once you have completed wearing your monitor, remove and place into box which has postage paid and place in your outgoing mailbox.  If for some reason you have misplaced your box then call our office and we can provide another box and/or mail it off for you.    Follow-Up: At [facility], you and your health needs are our priority.  As part of our continuing mission to provide you with exceptional heart care, we have created designated Provider Care Teams.  These Care Teams include your primary Cardiologist (physician) and Advanced Practice Providers (APPs -  Physician Assistants and Nurse Practitioners) who all work together to provide you with the care you need, when you need it.  We recommend signing up for the patient portal called "MyChart".  Sign up information is provided on this After Visit Summary.  MyChart is used to connect with patients for Virtual Visits (Telemedicine).  Patients are able to view lab/test results, encounter notes, upcoming appointments, etc.  Non-urgent messages can be sent to your provider as well.   To learn more about what you can do with MyChart, go to [email or web address].    Your next appointment:   6-8 weeks  The format for your next appointment:   In Person  Provider:   You may see Debbe Odea, MD or one of the following Advanced Practice Providers on your designated Care Team:   Nicolasa Ducking, NP Eula Listen, PA-C Cadence Fransico Michael, [place]     Other Instructions

## 2021-04-25 NOTE — Progress Notes (Signed)
Cardiology Office Note:    Date:  04/25/2021   ID:  Chen Holzman, DOB 1970/10/14, MRN [id number]  PCP:  Armando Gang, FNP   [facility] Providers Cardiologist:  Debbe Odea, MD     Referring MD: Armando Gang, FNP   Chief Complaint  Patient presents with   New Patient (Initial Visit)    Self referral -- Patient c/o irregular heartbeat, Stabbing chest pain, and "something is nibbling on her heart. Meds reviewed verbally with patient.      History of Present Illness:    Stefanie Glass is a 51 y.o. female with a hx of hypothyroidism who presents due to irregular heartbeat.  Patient has occasional skipped heartbeats and elevated heart rates ongoing over the past year.  She has a smart watch which has recorded heart rates up to 115 bpm.  Denies dizziness but states having occasional mental fog.  She is on hormone replacement therapy due to menopause including testosterone pellets, progesterone.  Takes Aldactone to help with swelling from HRT.  Has occasional nonexertional sharp chest pain.  Otherwise she states being very healthy, exercises at least 3 times weekly without any issues.  States eating healthy, low-salt, low-cholesterol diet.  Was diagnosed with mitral valve prolapse around age 21.  Past Medical History:  Diagnosis Date   H. pylori infection    Thyroid disease     History reviewed. No pertinent surgical history.  Current Medications: Current Meds  Medication Sig   dicyclomine (BENTYL) 10 MG capsule Take 20 mg by mouth 3 (three) times daily as needed for spasms.   levothyroxine (SYNTHROID) 100 MCG tablet Take 100 mcg by mouth daily before breakfast.   Multiple Vitamin (THERA) TABS Take 1 tablet by mouth daily.   progesterone (PROMETRIUM) 200 MG capsule SMARTSIG:1 Capsule(s) By Mouth Every Evening   spironolactone (ALDACTONE) 100 MG tablet Take 100 mg by mouth daily.     Allergies:   Sulfa antibiotics   Social History   Socioeconomic History    Marital status: Single    Spouse name: Not on file   Number of children: Not on file   Years of education: Not on file   Highest education level: Not on file  Occupational History   Not on file  Tobacco Use   Smoking status: Never   Smokeless tobacco: Never  Vaping Use   Vaping Use: Never used  Substance and Sexual Activity   Alcohol use: Not Currently   Drug use: Never   Sexual activity: Not on file  Other Topics Concern   Not on file  Social History Narrative   Not on file   Social Determinants of Health   Financial Resource Strain: Not on file  Food Insecurity: Not on file  Transportation Needs: Not on file  Physical Activity: Not on file  Stress: Not on file  Social Connections: Not on file     Family History: The patient's family history includes Cancer in her father and mother; Dementia in her father; Diabetes in her mother.  ROS:   Please see the history of present illness.     All other systems reviewed and are negative.  EKGs/Labs/Other Studies Reviewed:    The following studies were reviewed today:   EKG:  EKG is  ordered today.  The ekg ordered today demonstrates normal sinus rhythm  Recent Labs: 02/11/2021: ALT 21; BUN 20; Creatinine, Ser 0.76; Hemoglobin 13.3; Platelets 271; Potassium 4.0; Sodium 137  Recent Lipid Panel No results found for: CHOL,  TRIG, HDL, CHOLHDL, VLDL, LDLCALC, LDLDIRECT   Risk Assessment/Calculations:         Physical Exam:    VS:  BP 110/70 (BP Location: Left Arm, Patient Position: Sitting, Cuff Size: Normal)    Pulse 70    Ht 5\' 3"  (1.6 m)    Wt 134 lb (60.8 kg)    SpO2 98%    BMI 23.74 kg/m     Wt Readings from Last 3 Encounters:  04/25/21 134 lb (60.8 kg)  07/28/20 130 lb (59 kg)     GEN:  Well nourished, well developed in no acute distress HEENT: Normal NECK: No JVD; No carotid bruits LYMPHATICS: No lymphadenopathy CARDIAC: RRR, no murmurs, rubs, gallops RESPIRATORY:  Clear to auscultation without rales,  wheezing or rhonchi  ABDOMEN: Soft, non-tender, non-distended MUSCULOSKELETAL:  No edema; No deformity  SKIN: Warm and dry NEUROLOGIC:  Alert and oriented x 3 PSYCHIATRIC:  Normal affect   ASSESSMENT:    1. Palpitations   2. Chest pain, unspecified type   3. Hx of mitral valve prolapse    PLAN:    In order of problems listed above:  Palpitations, irregular heartbeats, place cardiac monitor to evaluate any significant arrhythmias.   Occasional chest pain appears atypical and not consistent with cardiac etiology. Mitral valve prolapse history, obtain echo to evaluate valvular pathology, presence of MR.  Follow-up after echo and cardiac monitor       Medication Adjustments/Labs and Tests Ordered: Current medicines are reviewed at length with the patient today.  Concerns regarding medicines are outlined above.  Orders Placed This Encounter  Procedures   LONG TERM MONITOR (3-14 DAYS)   EKG 12-Lead   ECHOCARDIOGRAM COMPLETE   No orders of the defined types were placed in this encounter.   Patient Instructions  Medication Instructions:  Your physician recommends that you continue on your current medications as directed. Please refer to the Current Medication list given to you today.  *If you need a refill on your cardiac medications before your next appointment, please call your pharmacy*   Lab Work: None ordered If you have labs (blood work) drawn today and your tests are completely normal, you will receive your results only by: MyChart Message (if you have MyChart) OR A paper copy in the mail If you have any lab test that is abnormal or we need to change your treatment, we will call you to review the results.   Testing/Procedures:   Your physician has requested that you have an echocardiogram. Echocardiography is a painless test that uses sound waves to create images of your heart. It provides your doctor with information about the size and shape of your heart and how  well your hearts chambers and valves are working. This procedure takes approximately one hour. There are no restrictions for this procedure.  Your physician has recommended that you wear a Zio XT monitor for 2 weeks. This will be mailed to you    in 4-5 business days.   This monitor is a medical device that records the hearts electrical activity. Doctors most often use these monitors to diagnose arrhythmias. Arrhythmias are problems with the speed or rhythm of the heartbeat. The monitor is a small device applied to your chest. You can wear one while you do your normal daily activities. While wearing this monitor if you have any symptoms to push the button and record what you felt. Once you have worn this monitor for the period of time provider prescribed (Usually 14  days), you will return the monitor device in the postage paid box. Once it is returned they will download the data collected and provide [place] with a report which the provider will then review and we will call you with those results. Important tips:  Avoid showering during the first 24 hours of wearing the monitor. Avoid excessive sweating to help maximize wear time. Do not submerge the device, no hot tubs, and no swimming pools. Keep any lotions or oils away from the patch. After 24 hours you may shower with the patch on. Take brief showers with your back facing the shower head.  Do not remove patch once it has been placed because that will interrupt data and decrease adhesive wear time. Push the button when you have any symptoms and write down what you were feeling. Once you have completed wearing your monitor, remove and place into box which has postage paid and place in your outgoing mailbox.  If for some reason you have misplaced your box then call our office and we can provide another box and/or mail it off for you.    Follow-Up: At [facility], you and your health needs are our priority.  As part of our continuing mission to  provide you with exceptional heart care, we have created designated Provider Care Teams.  These Care Teams include your primary Cardiologist (physician) and Advanced Practice Providers (APPs -  Physician Assistants and Nurse Practitioners) who all work together to provide you with the care you need, when you need it.  We recommend signing up for the patient portal called "MyChart".  Sign up information is provided on this After Visit Summary.  MyChart is used to connect with patients for Virtual Visits (Telemedicine).  Patients are able to view lab/test results, encounter notes, upcoming appointments, etc.  Non-urgent messages can be sent to your provider as well.   To learn more about what you can do with MyChart, go to [email or web address].    Your next appointment:   6-8 weeks  The format for your next appointment:   In Person  Provider:   You may see Debbe Odea, MD or one of the following Advanced Practice Providers on your designated Care Team:   Nicolasa Ducking, NP Eula Listen, PA-C Cadence Fransico Michael, [place]    Other Instructions     Signed, Debbe Odea, MD  04/25/2021 10:51 AM     [facility]

## 2021-04-27 DIAGNOSIS — R002 Palpitations: Secondary | ICD-10-CM

## 2021-05-16 ENCOUNTER — Other Ambulatory Visit: Payer: Self-pay

## 2021-05-16 ENCOUNTER — Ambulatory Visit (INDEPENDENT_AMBULATORY_CARE_PROVIDER_SITE_OTHER): Payer: No Typology Code available for payment source

## 2021-05-16 DIAGNOSIS — I341 Nonrheumatic mitral (valve) prolapse: Secondary | ICD-10-CM | POA: Diagnosis not present

## 2021-05-16 DIAGNOSIS — Z8679 Personal history of other diseases of the circulatory system: Secondary | ICD-10-CM

## 2021-05-16 LAB — ECHOCARDIOGRAM COMPLETE
AR max vel: 2.79 cm2
AV Area VTI: 2.74 cm2
AV Area mean vel: 2.72 cm2
AV Mean grad: 5 mmHg
AV Peak grad: 9.4 mmHg
Ao pk vel: 1.53 m/s
Area-P 1/2: 3.37 cm2
Calc EF: 56.7 %
S' Lateral: 2.5 cm
Single Plane A2C EF: 56.5 %
Single Plane A4C EF: 55.9 %

## 2021-05-19 ENCOUNTER — Telehealth: Payer: Self-pay | Admitting: Cardiology

## 2021-05-19 NOTE — Telephone Encounter (Signed)
Debbe Odea, MD  Gibson Ramp, RN ?Echo shows normal systolic and diastolic function.  Mild anterior mitral valve prolapse.  No significant regurgitation.  Overall okay echocardiogram.  ?

## 2021-05-19 NOTE — Telephone Encounter (Signed)
Patient made aware of echo results with verbalized understanding. ? ?Patient request to cancel her scheduled f/u with Dr. Azucena Cecil and in turn ask that he order additional testing. ?Patient is concerned that she has symptoms of adrenal gland insuffiencey. Encouraged the patient to keep her appt with Dr. Azucena Cecil to discuss her concern or follow up with her pcp. ?Pt again declined to keep the appt and rqst that her rqst be fwd to him to advise. ? ?Adv the patient that I will fwd the rqst to Dr. Merita Norton nurse Doy Hutching, RN to f/u and advise as requested. ? ?

## 2021-05-23 NOTE — Telephone Encounter (Signed)
Called patient and left a detailed VM per DPR on file informing her that she would need to keep her follow up appointment with Dr. Garen Lah to discuss any further testing and her concerns. I requested a call back to let me know how she would like to proceed. ?

## 2021-06-06 ENCOUNTER — Ambulatory Visit: Payer: BC Managed Care – PPO | Admitting: Cardiology

## 2021-06-06 ENCOUNTER — Encounter: Payer: Self-pay | Admitting: Cardiology

## 2021-06-06 VITALS — BP 130/62 | HR 77 | Ht 64.0 in | Wt 133.0 lb

## 2021-06-06 DIAGNOSIS — R002 Palpitations: Secondary | ICD-10-CM | POA: Diagnosis not present

## 2021-06-06 DIAGNOSIS — Z8679 Personal history of other diseases of the circulatory system: Secondary | ICD-10-CM

## 2021-06-06 NOTE — Patient Instructions (Signed)
Medication Instructions:   Your physician recommends that you continue on your current medications as directed. Please refer to the Current Medication list given to you today.   *If you need a refill on your cardiac medications before your next appointment, please call your pharmacy*   Lab Work: None ordered  If you have labs (blood work) drawn today and your tests are completely normal, you will receive your results only by: MyChart Message (if you have MyChart) OR A paper copy in the mail If you have any lab test that is abnormal or we need to change your treatment, we will call you to review the results.   Testing/Procedures: None ordered   Follow-Up: At [facility], you and your health needs are our priority.  As part of our continuing mission to provide you with exceptional heart care, we have created designated Provider Care Teams.  These Care Teams include your primary Cardiologist (physician) and Advanced Practice Providers (APPs -  Physician Assistants and Nurse Practitioners) who all work together to provide you with the care you need, when you need it.  We recommend signing up for the patient portal called "MyChart".  Sign up information is provided on this After Visit Summary.  MyChart is used to connect with patients for Virtual Visits (Telemedicine).  Patients are able to view lab/test results, encounter notes, upcoming appointments, etc.  Non-urgent messages can be sent to your provider as well.   To learn more about what you can do with MyChart, go to [email or web address].    Your next appointment:   Your physician wants you to follow-up in: 1 year.   You will receive a reminder letter in the mail two months in advance. If you don't receive a letter, please call our office to schedule the follow-up appointment.   The format for your next appointment:   In Person  Provider:   You may see Brian Agbor-Etang, MD or one of the following Advanced Practice Providers  on your designated Care Team:   Christopher Berge, NP Ryan Dunn, PA-C Cadence Furth, PA-C   Other Instructions N/A  Important Information About Sugar       

## 2021-06-06 NOTE — Progress Notes (Signed)
?Cardiology Office Note:   ? ?Date:  06/06/2021  ? ?ID:  Stefanie Glass, DOB [birth year], MRN [id number] ? ?PCP:  Armando Gang, FNP ?  ?[facility] Providers ?Cardiologist:  Debbe Odea, MD    ? ?Referring MD: Armando Gang, FNP  ? ?Chief Complaint  ?Patient presents with  ? Other  ?  6 week follow up - Meds reviewed verbally with patient.   ? ? ?History of Present Illness:   ? ?Stefanie Glass is a 51 y.o. female with a hx of hypothyroidism, mitral valve prolapse who presents for follow-up.  Previously seen due to irregular heartbeat and history of mitral valve prolapse.   ? ?Cardiac monitor was placed to evaluate any significant arrhythmias, echocardiogram obtained to evaluate for any significant valvular abnormalities. ? ?She states being under a lot of stress with her work and family.  Has been trying to control stress with much improvement in her symptoms.  States feeling much better.  Has no other or new concerns at this time. ?  ? ?Prior notes/studies ?She is on hormone replacement therapy due to menopause including testosterone pellets, progesterone.  Takes Aldactone to help with swelling from HRT.   ?Was diagnosed with mitral valve prolapse around age 21. ? ?Past Medical History:  ?Diagnosis Date  ? H. pylori infection   ? Thyroid disease   ? ? ?History reviewed. No pertinent surgical history. ? ?Current Medications: ?Current Meds  ?Medication Sig  ? dicyclomine (BENTYL) 10 MG capsule Take 20 mg by mouth 3 (three) times daily as needed for spasms.  ? levothyroxine (SYNTHROID) 100 MCG tablet Take 100 mcg by mouth daily before breakfast.  ? Multiple Vitamin (THERA) TABS Take 1 tablet by mouth daily.  ? progesterone (PROMETRIUM) 200 MG capsule SMARTSIG:1 Capsule(s) By Mouth Every Evening  ? spironolactone (ALDACTONE) 100 MG tablet Take 100 mg by mouth daily.  ?  ? ?Allergies:   Sulfa antibiotics  ? ?Social History  ? ?Socioeconomic History  ? Marital status: Single  ?  Spouse name: Not on file   ? Number of children: Not on file  ? Years of education: Not on file  ? Highest education level: Not on file  ?Occupational History  ? Not on file  ?Tobacco Use  ? Smoking status: Never  ? Smokeless tobacco: Never  ?Vaping Use  ? Vaping Use: Never used  ?Substance and Sexual Activity  ? Alcohol use: Not Currently  ? Drug use: Never  ? Sexual activity: Not on file  ?Other Topics Concern  ? Not on file  ?Social History Narrative  ? Not on file  ? ?Social Determinants of Health  ? ?Financial Resource Strain: Not on file  ?Food Insecurity: Not on file  ?Transportation Needs: Not on file  ?Physical Activity: Not on file  ?Stress: Not on file  ?Social Connections: Not on file  ?  ? ?Family History: ?The patient's family history includes Cancer in her father and mother; Dementia in her father; Diabetes in her mother. ? ?ROS:   ?Please see the history of present illness.    ? All other systems reviewed and are negative. ? ?EKGs/Labs/Other Studies Reviewed:   ? ?The following studies were reviewed today: ? ? ?EKG:  EKG not  ordered today.  ? ?Recent Labs: ?02/11/2021: ALT 21; BUN 20; Creatinine, Ser 0.76; Hemoglobin 13.3; Platelets 271; Potassium 4.0; Sodium 137  ?Recent Lipid Panel ?No results found for: CHOL, TRIG, HDL, CHOLHDL, VLDL, LDLCALC, LDLDIRECT ? ? ?Risk Assessment/Calculations:   ? ?    ? ?  Physical Exam:   ? ?VS:  BP 130/62 (BP Location: Left Arm, Patient Position: Sitting, Cuff Size: Normal)   Pulse 77   Ht 5\' 4"  (1.626 m)   Wt 133 lb (60.3 kg)   SpO2 98%   BMI 22.83 kg/m?    ? ?Wt Readings from Last 3 Encounters:  ?06/06/21 133 lb (60.3 kg)  ?04/25/21 134 lb (60.8 kg)  ?07/28/20 130 lb (59 kg)  ?  ? ?GEN:  Well nourished, well developed in no acute distress ?HEENT: Normal ?NECK: No JVD; No carotid bruits ?CARDIAC: RRR, no murmurs, rubs, gallops ?RESPIRATORY:  Clear to auscultation without rales, wheezing or rhonchi  ?ABDOMEN: Soft, non-tender, non-distended ?MUSCULOSKELETAL:  No edema; No deformity   ?SKIN: Warm and dry ?NEUROLOGIC:  Alert and oriented x 3 ?PSYCHIATRIC:  Normal affect  ? ?ASSESSMENT:   ? ?1. Palpitations   ?2. Hx of mitral valve prolapse   ? ?PLAN:   ? ?In order of problems listed above: ? ?Palpitations, cardiac monitor showed paroxysmal SVT not associated with patient triggered events.  No other significant arrhythmias noted.  Overall benign cardiac monitor. ?Mild anterior mitral valve prolapse, mild MR.,  EF 60 to 65%, diastolic function are normal.  Continue monitoring with serial echocardiograms. ? ?Follow-up in 1-2 years. ? ?   ? ?Medication Adjustments/Labs and Tests Ordered: ?Current medicines are reviewed at length with the patient today.  Concerns regarding medicines are outlined above.  ?No orders of the defined types were placed in this encounter. ? ?No orders of the defined types were placed in this encounter. ? ? ?Patient Instructions  ?Medication Instructions:  ?Your physician recommends that you continue on your current medications as directed. Please refer to the Current Medication list given to you today. ? ?*If you need a refill on your cardiac medications before your next appointment, please call your pharmacy* ? ? ?Lab Work: ?None ordered ?If you have labs (blood work) drawn today and your tests are completely normal, you will receive your results only by: ?MyChart Message (if you have MyChart) OR ?A paper copy in the mail ?If you have any lab test that is abnormal or we need to change your treatment, we will call you to review the results. ? ? ?Testing/Procedures: ?None ordered ? ? ?Follow-Up: ?At [facility], you and your health needs are our priority.  As part of our continuing mission to provide you with exceptional heart care, we have created designated Provider Care Teams.  These Care Teams include your primary Cardiologist (physician) and Advanced Practice Providers (APPs -  Physician Assistants and Nurse Practitioners) who all work together to provide you with the  care you need, when you need it. ? ?We recommend signing up for the patient portal called "MyChart".  Sign up information is provided on this After Visit Summary.  MyChart is used to connect with patients for Virtual Visits (Telemedicine).  Patients are able to view lab/test results, encounter notes, upcoming appointments, etc.  Non-urgent messages can be sent to your provider as well.   ?To learn more about what you can do with MyChart, go to [facility].   ? ?Your next appointment:   ?Your physician wants you to follow-up in: 1 year You will receive a reminder letter in the mail two months in advance. If you don't receive a letter, please call our office to schedule the follow-up appointment. ? ? ?The format for your next appointment:   ?In Person ? ?Provider:   ?You may see [email or web address]  Agbor-Etang, MD or one of the following Advanced Practice Providers on your designated Care Team:   ?Nicolasa Duckinghristopher Berge, NP ?Eula Listenyan Dunn, PA-C ?Cadence Fransico MichaelFurth, PA-C{ ? ? ? ?Other Instructions ?N/A ? ?Important Information About Sugar ? ? ? ? ? ?  ? ?Signed, ?Debbe OdeaBrian Agbor-Etang, MD  ?06/06/2021 10:10 AM    ?[facility] ?

## 2021-06-19 DIAGNOSIS — S99921A Unspecified injury of right foot, initial encounter: Secondary | ICD-10-CM | POA: Diagnosis not present

## 2021-06-23 DIAGNOSIS — M79671 Pain in right foot: Secondary | ICD-10-CM | POA: Diagnosis not present

## 2021-06-23 DIAGNOSIS — M25552 Pain in left hip: Secondary | ICD-10-CM | POA: Diagnosis not present

## 2021-07-11 DIAGNOSIS — R5383 Other fatigue: Secondary | ICD-10-CM | POA: Diagnosis not present

## 2021-07-11 DIAGNOSIS — R7989 Other specified abnormal findings of blood chemistry: Secondary | ICD-10-CM | POA: Diagnosis not present

## 2021-07-11 DIAGNOSIS — G5761 Lesion of plantar nerve, right lower limb: Secondary | ICD-10-CM | POA: Diagnosis not present

## 2021-07-11 DIAGNOSIS — E288 Other ovarian dysfunction: Secondary | ICD-10-CM | POA: Diagnosis not present

## 2021-07-11 DIAGNOSIS — M7741 Metatarsalgia, right foot: Secondary | ICD-10-CM | POA: Diagnosis not present

## 2021-07-11 DIAGNOSIS — J310 Chronic rhinitis: Secondary | ICD-10-CM | POA: Diagnosis not present

## 2021-07-11 DIAGNOSIS — J305 Allergic rhinitis due to food: Secondary | ICD-10-CM | POA: Diagnosis not present

## 2021-07-11 DIAGNOSIS — M79671 Pain in right foot: Secondary | ICD-10-CM | POA: Diagnosis not present

## 2021-08-25 ENCOUNTER — Ambulatory Visit (INDEPENDENT_AMBULATORY_CARE_PROVIDER_SITE_OTHER): Payer: BC Managed Care – PPO | Admitting: Pulmonary Disease

## 2021-08-25 ENCOUNTER — Encounter: Payer: Self-pay | Admitting: Pulmonary Disease

## 2021-08-25 VITALS — BP 110/64 | HR 72 | Temp 98.8°F | Ht 63.0 in | Wt 137.0 lb

## 2021-08-25 DIAGNOSIS — R053 Chronic cough: Secondary | ICD-10-CM | POA: Diagnosis not present

## 2021-08-25 MED ORDER — AZELASTINE HCL 0.1 % NA SOLN: NASAL | 12 refills | Status: DC

## 2021-08-25 MED ORDER — FLUTICASONE PROPIONATE 50 MCG/ACT NA SUSP: NASAL | 2 refills | Status: DC

## 2021-08-25 NOTE — Patient Instructions (Signed)
Nice to meet you  I think the cough could be related to multiple factors.  However given the nasal congestion and description of dripping mucus in the back of the throat I recommend the following:  1) get a sinus rinse bottle or Nettie pot -use distilled or purified water and mix with salt packets.  These can be found on the sinus or cold I will at the drugstore.  Mix as directed and use in the morning and evenings, twice a day.  Do this for 2 weeks.  2) use Flonase 1 spray each nostril twice a day for a week, then decrease to 1 spray each nostril daily  3) Use azelastine 2 spray each nostril twice a day for 1 week then decrease to 2 spray each nostril once daily  I sent a prescription for Flonase and azelastine.  You can check and see if the Flonase is cheaper over-the-counter.  If these are not working in the coming weeks, we will consider using an inhaler to treat possible mucus production in the lungs or air tubes.  This often can be caused by asthma.  Send me a message if things are not improving in the next month or so so we can try an inhaler prior to your next visit.  Return to clinic in 3 months or sooner as needed with Dr. Judeth Horn

## 2021-09-11 NOTE — Progress Notes (Signed)
@Patient  ID: Stefanie Glass, female    DOB: Jan 20, 1971, 51 y.o.   MRN: [id number]  Chief Complaint  Patient presents with   Consult    Pt states she has had a persistent cough x5 months. Pt states she is coughing up green sputum and feels like there is a ball in her throat.    Referring provider: Remi Haggard, FNP  HPI:   51 y.o. woman whom we are seeing in consultation for evaluation of chronic cough.  Note from the provider reviewed.  Also about 5 months ago.  No clear trigger.  Denies any significant illness.  No change in location, job etc.  She does have some seasonal allergies.  Seems bit worse in the evenings, maybe mornings.  No position make things better or worse.  No seasonal or environmental factors she can identify to make things better or worse.  No relieving or exacerbating factors.  Cough is usually dry but eventually will produce some sputum.  Some congestion.  She feels it in deep in her throat or chest.  She does endorse postnasal drip symptoms.  Sensation of mucus on the back of her throat.  Not much rhinorrhea.  Some mild congestion.  No chest imaging to review.  PMH: Mitral valve prolapse Surgical history:History reviewed. No pertinent surgical history. Family history: Mother with diabetes, father with dementia Social history: Former smoker, minimal history, lives in [place] / Pulmonary Flowsheets:   ACT:      No data to display          MMRC:     No data to display          Epworth:      No data to display          Tests:   FENO:  No results found for: "NITRICOXIDE"  PFT:     No data to display          WALK:      No data to display          Imaging: No results found.  Lab Results: Personally reviewed CBC    Component Value Date/Time   WBC 6.0 02/11/2021 1647   RBC 4.66 02/11/2021 1647   HGB 13.3 02/11/2021 1647   HCT 41.2 02/11/2021 1647   PLT 271 02/11/2021 1647   MCV 88.4  02/11/2021 1647   MCH 28.5 02/11/2021 1647   MCHC 32.3 02/11/2021 1647   RDW 12.8 02/11/2021 1647   LYMPHSABS 2.7 02/11/2021 1647   MONOABS 0.4 02/11/2021 1647   EOSABS 0.1 02/11/2021 1647   BASOSABS 0.1 02/11/2021 1647    BMET    Component Value Date/Time   NA 137 02/11/2021 1647   K 4.0 02/11/2021 1647   CL 102 02/11/2021 1647   CO2 26 02/11/2021 1647   GLUCOSE 92 02/11/2021 1647   BUN 20 02/11/2021 1647   CREATININE 0.76 02/11/2021 1647   CALCIUM 9.5 02/11/2021 1647   GFRNONAA >60 02/11/2021 1647    BNP No results found for: "BNP"  ProBNP No results found for: "PROBNP"  Specialty Problems   None   Allergies  Allergen Reactions   Sulfa Antibiotics Anaphylaxis    Immunization History  Administered Date(s) Administered   Hepatitis B 12/18/2019, 06/14/2020, 01/22/2021   MMR 12/10/1974   Unspecified SARS-COV-2 Vaccination 03/12/2019, 04/09/2019, 01/18/2020, 01/13/2021   Varicella 12/09/1973    Past Medical History:  Diagnosis Date   H. pylori infection    Thyroid disease  Tobacco History: Social History   Tobacco Use  Smoking Status Former   Packs/day: 0.25   Types: Cigarettes   Passive exposure: Past  Smokeless Tobacco Never   Counseling given: Not Answered   Continue to not smoke  Outpatient Encounter Medications as of 08/25/2021  Medication Sig   azelastine (ASTELIN) 0.1 % nasal spray 2 sprays each nare twice a day for 7 days then 2 sprays each nare once daily thereafter   dicyclomine (BENTYL) 10 MG capsule Take 20 mg by mouth 3 (three) times daily as needed for spasms.   fluticasone (FLONASE) 50 MCG/ACT nasal spray 1 spray each nare twice daily for 7 days then decrease to 1 spray each nare daily   levothyroxine (SYNTHROID) 100 MCG tablet Take 100 mcg by mouth daily before breakfast.   Multiple Vitamin (THERA) TABS Take 1 tablet by mouth daily.   progesterone (PROMETRIUM) 200 MG capsule SMARTSIG:1 Capsule(s) By Mouth Every Evening    spironolactone (ALDACTONE) 100 MG tablet Take 100 mg by mouth daily.   No facility-administered encounter medications on file as of 08/25/2021.     Review of Systems  Review of Systems  No chest pain with exertion.  No orthopnea or PND.  No lower extremity swelling.  Comprehensive review of systems otherwise negative. Physical Exam  BP 110/64 (BP Location: Left Arm, Patient Position: Sitting, Cuff Size: Normal)   Pulse 72   Temp 98.8 F (37.1 C) (Oral)   Ht 5' 3"  (1.6 m)   Wt 137 lb (62.1 kg)   SpO2 97% Comment: on RA  BMI 24.27 kg/m   Wt Readings from Last 5 Encounters:  08/25/21 137 lb (62.1 kg)  06/06/21 133 lb (60.3 kg)  04/25/21 134 lb (60.8 kg)  07/28/20 130 lb (59 kg)    BMI Readings from Last 5 Encounters:  08/25/21 24.27 kg/m  06/06/21 22.83 kg/m  04/25/21 23.74 kg/m  07/28/20 22.31 kg/m     Physical Exam General: Well-appearing, no acute distress Eyes: EOMI, no icterus Neck: Supple, no JVP appreciated Pulmonary: Clear, no work of breathing Cardiovascular: Regular rate and rhythm, no murmur Abdomen: Nondistended, bowel sounds present MSK: No synovitis, joint effusion Neuro: Normal gait, no weakness Psych: Normal mood, full affect   Assessment & Plan:   Chronic cough: Present for 5 months.  Associate with postnasal drip.  Denies GERD symptoms.  Does have congestion in chest, unclear if originating chest but given her postnasal drip symptoms feel like it is originating above the vocal cords.  Recommend sinus rinses, Flonase, azelastine spray.  If not improving in coming weeks consider addition of ICS/LABA therapy to treat potential inflammation, phlegm being produced in the bronchial tubes.   Return in about 3 months (around 11/25/2021).   Lanier Clam, MD 09/11/2021

## 2021-09-13 DIAGNOSIS — E782 Mixed hyperlipidemia: Secondary | ICD-10-CM | POA: Diagnosis not present

## 2021-09-13 DIAGNOSIS — N951 Menopausal and female climacteric states: Secondary | ICD-10-CM | POA: Diagnosis not present

## 2021-09-21 DIAGNOSIS — R5383 Other fatigue: Secondary | ICD-10-CM | POA: Diagnosis not present

## 2021-09-21 DIAGNOSIS — E063 Autoimmune thyroiditis: Secondary | ICD-10-CM | POA: Diagnosis not present

## 2021-09-21 DIAGNOSIS — R208 Other disturbances of skin sensation: Secondary | ICD-10-CM | POA: Diagnosis not present

## 2021-11-13 ENCOUNTER — Encounter (HOSPITAL_COMMUNITY): Payer: Self-pay

## 2021-11-13 ENCOUNTER — Other Ambulatory Visit: Payer: Self-pay

## 2021-11-13 ENCOUNTER — Emergency Department (HOSPITAL_COMMUNITY)
Admission: EM | Admit: 2021-11-13 | Discharge: 2021-11-13 | Disposition: A | Discharge status: Home / Self Care | Payer: BC Managed Care – PPO | Attending: Emergency Medicine | Admitting: Emergency Medicine

## 2021-11-13 ENCOUNTER — Emergency Department (HOSPITAL_COMMUNITY): Payer: BC Managed Care – PPO

## 2021-11-13 DIAGNOSIS — R739 Hyperglycemia, unspecified: Secondary | ICD-10-CM | POA: Diagnosis not present

## 2021-11-13 DIAGNOSIS — R7989 Other specified abnormal findings of blood chemistry: Secondary | ICD-10-CM | POA: Insufficient documentation

## 2021-11-13 DIAGNOSIS — R079 Chest pain, unspecified: Secondary | ICD-10-CM | POA: Diagnosis present

## 2021-11-13 DIAGNOSIS — R0602 Shortness of breath: Secondary | ICD-10-CM | POA: Diagnosis not present

## 2021-11-13 LAB — D-DIMER, QUANTITATIVE: D-Dimer, Quant: 0.29 ug/mL-FEU (ref 0.00–0.50)

## 2021-11-13 LAB — BASIC METABOLIC PANEL
Anion gap: 7 (ref 5–15)
BUN: 23 mg/dL — ABNORMAL HIGH (ref 6–20)
CO2: 25 mmol/L (ref 22–32)
Calcium: 9.2 mg/dL (ref 8.9–10.3)
Chloride: 108 mmol/L (ref 98–111)
Creatinine, Ser: 0.73 mg/dL (ref 0.44–1.00)
GFR, Estimated: >60 mL/min (ref >60)
Glucose, Bld: 118 mg/dL — ABNORMAL HIGH (ref 70–99)
Potassium: 3.9 mmol/L (ref 3.5–5.1)
Sodium: 140 mmol/L (ref 135–145)

## 2021-11-13 LAB — CBC
HCT: 40.1 % (ref 36.0–46.0)
Hemoglobin: 13.2 g/dL (ref 12.0–15.0)
MCH: 29.3 pg (ref 26.0–34.0)
MCHC: 32.9 g/dL (ref 30.0–36.0)
MCV: 88.9 fL (ref 80.0–100.0)
Platelets: 254 10*3/uL (ref 150–400)
RBC: 4.51 MIL/uL (ref 3.87–5.11)
RDW: 11.9 % (ref 11.5–15.5)
WBC: 5.4 10*3/uL (ref 4.0–10.5)
nRBC: 0 % (ref 0.0–0.2)

## 2021-11-13 LAB — TROPONIN I (HIGH SENSITIVITY)
Troponin I (High Sensitivity): <2 ng/L (ref <18)
Troponin I (High Sensitivity): 2 ng/L (ref <18)

## 2021-11-13 MED ORDER — IBUPROFEN 200 MG PO TABS: 600.0000 mg | ORAL_TABLET | Freq: Once | ORAL | Status: DC

## 2021-11-13 NOTE — ED Triage Notes (Signed)
Pt reports left sided chest pain radiating through to shoulder blade, left neck, down left arm and under left breast with productive cough that woke her from sleep this am.

## 2021-11-13 NOTE — Discharge Instructions (Addendum)
Work-up for your chest pain today was overall reassuring.  EKG was normal along with normal vital signs.  Also cardiac enzymes were within normal limits which is encouraging.  Recommend that you follow-up with your PCP regarding ongoing chest pain.  Recommend Tylenol and ibuprofen as needed for pain.  If you have worsening shortness of breath, worsening chest pain, passout or feel like you want to pass out, new calf tenderness or any other concerning symptom please return to the emergency department for further evaluation.

## 2021-11-13 NOTE — ED Provider Triage Note (Signed)
Emergency Medicine Provider Triage Evaluation Note  Stefanie Glass , a 51 y.o. female  was evaluated in triage.  Pt complains of worsening acute on chronic left sided chest pain.  Has been intermittent over the last few months.  However this morning, patient states she never felt the pain this intense before, though qualities are similar.  Pain described as "comes and goes" and occasionally sharp.  Also now including the left neck and shoulder.  Denies recent fevers, leg weakness, arm weakness, or chills.  Recently had a cardiac work-up that was overall negative.  Denies shortness of breath.  Unsure what makes it better/worse.  No Hx of MI.  Review of Systems  Positive:  Negative: See above  Physical Exam  BP 125/79 (BP Location: Left Arm)   Pulse 78   Temp 98.6 F (37 C) (Oral)   Resp 16   Ht 5\' 3"  (1.6 m)   Wt 62 kg   SpO2 97%   BMI 24.21 kg/m  Gen:   Awake, no distress, walking around the room, appears comfortable Resp:  Normal effort, CTAB MSK:   Moves extremities without difficulty  Other:  RRR without M/R/G.  Medical Decision Making  Medically screening exam initiated at 3:20 PM.  Appropriate orders placed.  Georganne Siple was informed that the remainder of the evaluation will be completed by another provider, this initial triage assessment does not replace that evaluation, and the importance of remaining in the ED until their evaluation is complete.     Prince Rome, PA-C 44/31/54 [id number]

## 2021-11-13 NOTE — ED Provider Notes (Signed)
[facility] DEPT Provider Note   CSN: [id number] Arrival date & time: 11/13/21  1435     History  Chief Complaint  Patient presents with   Chest Pain   HPI Stefanie Glass is a 51 y.o. female presented for chest pain.  Stated that chest pain started 4 months ago.  Today pain worse and started to radiate down the arm which prompted evaluation in the ED.  Chest pain comes and goes.  Chest pain is stabbing pain sometimes feels like numbness and tingling.  Taken no medication for symptoms.  Also endorses reductive cough with white sputum.   She was seen by cardiology and work-up was overall unremarkable.  Was diagnosed with mitral valve prolapse.  Endorses shortness of breath.  Endorses strong family history of cancer.   Chest Pain      Home Medications Prior to Admission medications   Medication Sig Start Date End Date Taking? Authorizing Provider  azelastine (ASTELIN) 0.1 % nasal spray 2 sprays each nare twice a day for 7 days then 2 sprays each nare once daily thereafter 08/25/21   Hunsucker, Bonna Gains, MD  dicyclomine (BENTYL) 10 MG capsule Take 20 mg by mouth 3 (three) times daily as needed for spasms. 07/28/20   [provider]  fluticasone (FLONASE) 50 MCG/ACT nasal spray 1 spray each nare twice daily for 7 days then decrease to 1 spray each nare daily 08/25/21   Hunsucker, Bonna Gains, MD  levothyroxine (SYNTHROID) 100 MCG tablet Take 100 mcg by mouth daily before breakfast.    [provider]  Multiple Vitamin (THERA) TABS Take 1 tablet by mouth daily.    [provider]  progesterone (PROMETRIUM) 200 MG capsule SMARTSIG:1 Capsule(s) By Mouth Every Evening 04/10/21   [provider]  spironolactone (ALDACTONE) 100 MG tablet Take 100 mg by mouth daily. 07/10/20   [provider]      Allergies    Sulfa antibiotics and Sulfacetamide-prednisolone    Review of Systems   Review of Systems  Cardiovascular:   Positive for chest pain.    Physical Exam Updated Vital Signs BP 100/74   Pulse 66   Temp 98.6 F (37 C) (Oral)   Resp 15   Ht 5\' 3"  (1.6 m)   Wt 62 kg   SpO2 99%   BMI 24.21 kg/m  Physical Exam Vitals and nursing note reviewed.  HENT:     Head: Normocephalic and atraumatic.     Mouth/Throat:     Mouth: Mucous membranes are moist.  Eyes:     General:        Right eye: No discharge.        Left eye: No discharge.     Conjunctiva/sclera: Conjunctivae normal.  Cardiovascular:     Rate and Rhythm: Normal rate and regular rhythm.     Pulses: Normal pulses.     Heart sounds: Normal heart sounds.  Pulmonary:     Effort: Pulmonary effort is normal.     Breath sounds: Normal breath sounds.  Abdominal:     General: Abdomen is flat.     Palpations: Abdomen is soft.  Musculoskeletal:     Right lower leg: Normal. No swelling. No edema.     Left lower leg: Normal. No swelling. No edema.  Skin:    General: Skin is warm and dry.  Neurological:     General: No focal deficit present.  Psychiatric:        Mood and Affect: Mood  normal.     ED Results / Procedures / Treatments   Labs (all labs ordered are listed, but only abnormal results are displayed) Labs Reviewed  BASIC METABOLIC PANEL - Abnormal; Notable for the following components:      Result Value   Glucose, Bld 118 (*)    BUN 23 (*)    All other components within normal limits  CBC  D-DIMER, QUANTITATIVE  TROPONIN I (HIGH SENSITIVITY)  TROPONIN I (HIGH SENSITIVITY)    EKG EKG Interpretation  Date/Time:  Monday November 13 2021 14:58:30 EDT Ventricular Rate:  76 PR Interval:  143 QRS Duration: 74 QT Interval:  364 QTC Calculation: 410 R Axis:   30 Text Interpretation: Sinus rhythm LAE, consider biatrial enlargement Confirmed by Alvester Chou [phone]) on 11/13/2021 6:07:44 PM  Radiology DG Chest 2 View  Result Date: 11/13/2021 CLINICAL DATA:  Chest pain. EXAM: CHEST - 2 VIEW COMPARISON:  None  Available. FINDINGS: The cardiac silhouette, mediastinal and hilar contours are within normal limits. The lungs are clear. No infiltrates, edema or effusions. No pulmonary lesions or pneumothorax. The bony thorax is intact. IMPRESSION: No acute cardiopulmonary findings. Electronically Signed   By: Rudie Meyer M.D.   On: 11/13/2021 15:50    Procedures Procedures    Medications Ordered in ED Medications  ibuprofen (ADVIL) tablet 600 mg (has no administration in time range)    ED Course/ Medical Decision Making/ A&P                           Medical Decision Making Amount and/or Complexity of Data Reviewed Labs: ordered.   This patient presents to the ED for concern of chest pain, this involves a number of treatment options, and is a complaint that carries with it a high risk of complications and morbidity.  The differential diagnosis includes ACS, PE, aortic dissection, and mitral valve prolapse   Co morbidities: Discussed in HPI    EMR reviewed including pt PMHx, past surgical history and past visits to ER.   See HPI for more details   Lab Tests:   I ordered and independently interpreted labs. Labs notable for hyperglycemia and elevated BUN   Imaging Studies:  NAD. I personally reviewed all imaging studies and no acute abnormality found. I agree with radiology interpretation.    Cardiac Monitoring:  The patient was maintained on a cardiac monitor.  I personally viewed and interpreted the cardiac monitored which showed an underlying rhythm of: NSR EKG non-ischemic   Medicines ordered:  No medication given at this time. Reevaluation of the patient after these medicines showed that the patient stayed the same I have reviewed the patients home medicines and have made adjustments as needed   Consults/Attending Physician   I discussed this case with my attending physician who cosigned this note including patient's presenting symptoms, physical exam, and planned  diagnostics and interventions. Attending physician stated agreement with plan or made changes to plan which were implemented.   Reevaluation:  After the interventions noted above I re-evaluated patient and found that they have : Stayed the same    Problem List / ED Course: Patient presented for chest pain.  Considered ACS but unlikely given normal EKG and normal troponin, and minimal risk factors.  Also considered PE given chest pain shortness of breath but unlikely given negative D-dimer and normal vital signs.  Also considered aortic dissection since patient expressed chest pain that radiated to the back but unlikely  given she is not hypertensive, symmetric pulses and otherwise normal vital signs.  Treated ongoing chest pain with ibuprofen.  Upon reevaluation patient stated that her symptoms were the same but certainly no worse and that she was no longer short of breath.  Discussed return precautions.  Advised patient follow-up with PCP.    Dispostion:  After consideration of the diagnostic results and the patients response to treatment, I feel that the patient would benefit from discharge and follow-up with PCP regarding ongoing chest pain.         Final Clinical Impression(s) / ED Diagnoses Final diagnoses:  Chest pain, unspecified type    Rx / DC Orders ED Discharge Orders     None         Gareth Eagle, PA-C 11/13/21 2020    Terald Sleeper, MD 11/13/21 [phone]

## 2021-11-22 ENCOUNTER — Encounter: Payer: Self-pay | Admitting: Pulmonary Disease

## 2021-11-22 ENCOUNTER — Ambulatory Visit: Payer: BC Managed Care – PPO | Admitting: Pulmonary Disease

## 2021-11-22 VITALS — BP 122/64 | HR 77 | Ht 63.0 in | Wt 137.0 lb

## 2021-11-22 DIAGNOSIS — R053 Chronic cough: Secondary | ICD-10-CM | POA: Diagnosis not present

## 2021-11-22 NOTE — Patient Instructions (Addendum)
Nice to see you!  Try using albuterol 2 puffs in the morning and mid afternoon - see if this helps with cough/congestion  If so, we should consider a longer acting inhaler with slightly different medicine to treat this  Return to clinic in 4 weeks or sooner

## 2021-11-22 NOTE — Progress Notes (Signed)
@Patient  ID: Stefanie Glass, female    DOB: 07-Jul-1970, 52 y.o.   MRN: [id number]  Chief Complaint  Patient presents with   Follow-up    3 mo f/u for cough.     Referring provider: Remi Haggard, FNP  HPI:   51 y.o. woman whom we are seeing in follow up for evaluation of chronic cough.  ED note 11/13/21 reviewed.  Returns for routine follow-up.  At last visit concern for postnasal drip in terms of cough.  Advised nasal regimen of sinus rinses, Flonase, azelastine.  Unclear how well this has been adhered to.  She states she is used them.  Cough is unchanged.  Still with clear to white phlegm.  Sensation of needing to clear her throat.  Not overtly bad cough but occasional cough.  Was seen in ED 11/13/2021 for chest pain.  Labs normal.  Chest x-ray reviewed and interpreted as flattened diaphragms, hyperinflation on the lateral film.  On further questioning is more like left-sided chest radiating to the left upper arm/shoulder.  With associated clicking in the shoulder joint.  HPI at initial visit: Started about 5 months ago.  No clear trigger.  Denies any significant illness.  No change in location, job etc.  She does have some seasonal allergies.  Seems bit worse in the evenings, maybe mornings.  No position make things better or worse.  No seasonal or environmental factors she can identify to make things better or worse.  No relieving or exacerbating factors.  Cough is usually dry but eventually will produce some sputum.  Some congestion.  She feels it in deep in her throat or chest.  She does endorse postnasal drip symptoms.  Sensation of mucus on the back of her throat.  Not much rhinorrhea.  Some mild congestion.  No chest imaging to review.  PMH: Mitral valve prolapse Surgical history:none Family history: Mother with diabetes, father with dementia Social history: Former smoker, minimal history, lives in [place] / Pulmonary Flowsheets:   ACT:      No data to  display          MMRC:     No data to display          Epworth:      No data to display          Tests:   FENO:  No results found for: "NITRICOXIDE"  PFT:     No data to display          WALK:      No data to display          Imaging: DG Chest 2 View  Result Date: 11/13/2021 CLINICAL DATA:  Chest pain. EXAM: CHEST - 2 VIEW COMPARISON:  None Available. FINDINGS: The cardiac silhouette, mediastinal and hilar contours are within normal limits. The lungs are clear. No infiltrates, edema or effusions. No pulmonary lesions or pneumothorax. The bony thorax is intact. IMPRESSION: No acute cardiopulmonary findings. Electronically Signed   By: Marijo Sanes M.D.   On: 11/13/2021 15:50    Lab Results: Personally reviewed CBC    Component Value Date/Time   WBC 5.4 11/13/2021 1540   RBC 4.51 11/13/2021 1540   HGB 13.2 11/13/2021 1540   HCT 40.1 11/13/2021 1540   PLT 254 11/13/2021 1540   MCV 88.9 11/13/2021 1540   MCH 29.3 11/13/2021 1540   MCHC 32.9 11/13/2021 1540   RDW 11.9 11/13/2021 1540   LYMPHSABS 2.7 02/11/2021 1647  MONOABS 0.4 02/11/2021 1647   EOSABS 0.1 02/11/2021 1647   BASOSABS 0.1 02/11/2021 1647    BMET    Component Value Date/Time   NA 140 11/13/2021 1540   K 3.9 11/13/2021 1540   CL 108 11/13/2021 1540   CO2 25 11/13/2021 1540   GLUCOSE 118 (H) 11/13/2021 1540   BUN 23 (H) 11/13/2021 1540   CREATININE 0.73 11/13/2021 1540   CALCIUM 9.2 11/13/2021 1540   GFRNONAA >60 11/13/2021 1540    BNP No results found for: "BNP"  ProBNP No results found for: "PROBNP"  Specialty Problems   None   Allergies  Allergen Reactions   Sulfa Antibiotics Anaphylaxis and Swelling   Sulfacetamide-Prednisolone Other (See Comments)    Immunization History  Administered Date(s) Administered   Hepatitis B 12/18/2019, 06/14/2020, 01/22/2021   MMR 12/10/1974   Unspecified SARS-COV-2 Vaccination 03/12/2019, 04/09/2019, 01/18/2020, 01/13/2021    Varicella 12/09/1973    Past Medical History:  Diagnosis Date   H. pylori infection    Thyroid disease     Tobacco History: Social History   Tobacco Use  Smoking Status Former   Packs/day: 0.25   Types: Cigarettes   Passive exposure: Past  Smokeless Tobacco Never   Counseling given: Not Answered   Continue to not smoke  Outpatient Encounter Medications as of 11/22/2021  Medication Sig   azelastine (ASTELIN) 0.1 % nasal spray 2 sprays each nare twice a day for 7 days then 2 sprays each nare once daily thereafter   dicyclomine (BENTYL) 10 MG capsule Take 20 mg by mouth 3 (three) times daily as needed for spasms.   Eluxadoline (VIBERZI) 75 MG TABS    fluticasone (FLONASE) 50 MCG/ACT nasal spray 1 spray each nare twice daily for 7 days then decrease to 1 spray each nare daily   levothyroxine (SYNTHROID) 100 MCG tablet Take 100 mcg by mouth daily before breakfast.   Multiple Vitamin (THERA) TABS Take 1 tablet by mouth daily.   progesterone (PROMETRIUM) 200 MG capsule SMARTSIG:1 Capsule(s) By Mouth Every Evening   spironolactone (ALDACTONE) 100 MG tablet Take 100 mg by mouth daily.   TALICIA 620-35.5-97 MG CPDR SMARTSIG:4 Capsule(s) By Mouth Every 8 Hours   No facility-administered encounter medications on file as of 11/22/2021.     Review of Systems  Review of Systems  N/a Physical Exam  BP 122/64   Pulse 77   Ht 5' 3"  (1.6 m)   Wt 137 lb (62.1 kg)   SpO2 97% Comment: on RA  BMI 24.27 kg/m   Wt Readings from Last 5 Encounters:  11/22/21 137 lb (62.1 kg)  11/13/21 136 lb 11 oz (62 kg)  08/25/21 137 lb (62.1 kg)  06/06/21 133 lb (60.3 kg)  04/25/21 134 lb (60.8 kg)    BMI Readings from Last 5 Encounters:  11/22/21 24.27 kg/m  11/13/21 24.21 kg/m  08/25/21 24.27 kg/m  06/06/21 22.83 kg/m  04/25/21 23.74 kg/m     Physical Exam General: Well-appearing, no acute distress Eyes: EOMI, no icterus Neck: Supple, no JVP appreciated Pulmonary: Clear, no  work of breathing Cardiovascular: Regular rate and rhythm, no murmur Abdomen: Nondistended, bowel sounds present MSK: No synovitis, joint effusion, not able to reproduce left-sided shoulder/chest pain with manipulation, passive motion of the left shoulder/rotator cuff Neuro: Normal gait, no weakness Psych: Normal mood, full affect   Assessment & Plan:   Chronic cough: Present for months, seemingly onset early 2023.  Associate with postnasal drip.  Denies GERD symptoms.  Does  have congestion in chest, unclear if originating chest but given her postnasal drip symptoms feel like it is originating above the vocal cords.  Recommended sinus rinses, Flonase, azelastine spray.  No improvement.  She thinks albuterol has helped some.  Advised her to use albuterol twice a day to see if this helps with this symptom.  Advised I think likely she would benefit from ICS/LABA therapy especially in the setting of flattened diaphragms and hyperinflation on the lateral film on recent chest x-ray.  She declines additional medications today.  If albuterol is helpful, strongly recommend initiation of ICS/LABA therapy in the future.  Chest/left shoulder pain: Comes and goes.  Not associated with exertion.  Not pleuritic.  No position make things better or worse.  Clicking sensation in the left shoulder joint with associated radiation down left arm.  Chest x-ray clear without etiology of discomfort.  Suspicious for MSK, query underlying rotator cuff.  Could not elicit this on exam today.  Consider referral to orthopedics versus PT in the future.   Return in about 4 weeks (around 12/20/2021).   Lanier Clam, MD 11/22/2021

## 2021-12-25 ENCOUNTER — Ambulatory Visit: Payer: BC Managed Care – PPO | Admitting: Pulmonary Disease

## 2021-12-25 ENCOUNTER — Encounter: Payer: Self-pay | Admitting: Pulmonary Disease

## 2021-12-25 VITALS — BP 126/64 | HR 57 | Wt 137.0 lb

## 2021-12-25 DIAGNOSIS — R053 Chronic cough: Secondary | ICD-10-CM | POA: Diagnosis not present

## 2021-12-25 NOTE — Patient Instructions (Signed)
Nice to see you again  Continue to albuterol as you are, I am glad it is been helpful  Try the Breztri inhaler and see if you think it is helpful, if so we can consider a different inhaler, a combination of inhaled corticosteroid and LABA in the future if symptoms are not well controlled on albuterol alone.  Return to clinic in 3 months or sooner as needed with Dr. Silas Flood

## 2021-12-25 NOTE — Progress Notes (Signed)
_0  ID: Stefanie Glass, female    DOB: [birth year], 51 y.o.   MRN: [id number]  Chief Complaint  Patient presents with   Follow-up    Pt is here for follow up for her cough. Pt states that she is doing well. She states that the medication is working well.     Referring provider: Remi Haggard, FNP  HPI:   51 y.o. woman whom we are seeing in follow up for evaluation of chronic cough.    Returns for routine follow-up.  In the past, tried high intensity nasal regimen.  No improvement.  Reviewed that given some flattened diaphragms on chest x-ray when in the ED 11/13/2021, obstructive lung disease or hyperinflation could be contributing.  She tried albuterol.  This helped immensely.  Not a lot of dyspnea but help with the cough.  Recently biked 40 miles.  She is very pleased with this.  Had not been to do this in a while.  HPI at initial visit: Started about 5 months ago.  No clear trigger.  Denies any significant illness.  No change in location, job etc.  She does have some seasonal allergies.  Seems bit worse in the evenings, maybe mornings.  No position make things better or worse.  No seasonal or environmental factors she can identify to make things better or worse.  No relieving or exacerbating factors.  Cough is usually dry but eventually will produce some sputum.  Some congestion.  She feels it in deep in her throat or chest.  She does endorse postnasal drip symptoms.  Sensation of mucus on the back of her throat.  Not much rhinorrhea.  Some mild congestion.  No chest imaging to review.  PMH: Mitral valve prolapse Surgical history:none Family history: Mother with diabetes, father with dementia Social history: Former smoker, minimal history, lives in [place] / Pulmonary Flowsheets:   ACT:      No data to display           MMRC:     No data to display           Epworth:      No data to display           Tests:   FENO:  No results  found for: "NITRICOXIDE"  PFT:     No data to display           WALK:      No data to display           Imaging: No results found.  Lab Results: Personally reviewed CBC    Component Value Date/Time   WBC 5.4 11/13/2021 1540   RBC 4.51 11/13/2021 1540   HGB 13.2 11/13/2021 1540   HCT 40.1 11/13/2021 1540   PLT 254 11/13/2021 1540   MCV 88.9 11/13/2021 1540   MCH 29.3 11/13/2021 1540   MCHC 32.9 11/13/2021 1540   RDW 11.9 11/13/2021 1540   LYMPHSABS 2.7 02/11/2021 1647   MONOABS 0.4 02/11/2021 1647   EOSABS 0.1 02/11/2021 1647   BASOSABS 0.1 02/11/2021 1647    BMET    Component Value Date/Time   NA 140 11/13/2021 1540   K 3.9 11/13/2021 1540   CL 108 11/13/2021 1540   CO2 25 11/13/2021 1540   GLUCOSE 118 (H) 11/13/2021 1540   BUN 23 (H) 11/13/2021 1540   CREATININE 0.73 11/13/2021 1540   CALCIUM 9.2 11/13/2021 1540   GFRNONAA >60 11/13/2021 1540    BNP No  results found for: "BNP"  ProBNP No results found for: "PROBNP"  Specialty Problems   None   Allergies  Allergen Reactions   Sulfa Antibiotics Anaphylaxis and Swelling   Sulfacetamide-Prednisolone Other (See Comments)    Immunization History  Administered Date(s) Administered   Hep B, Unspecified 12/18/2019, 06/14/2020, 01/22/2021   Hepatitis B 12/18/2019, 06/14/2020, 01/22/2021   MMR 12/10/1974   Unspecified SARS-COV-2 Vaccination 03/12/2019, 04/09/2019, 01/18/2020, 01/13/2021   Varicella 12/09/1973    Past Medical History:  Diagnosis Date   H. pylori infection    Thyroid disease     Tobacco History: Social History   Tobacco Use  Smoking Status Former   Packs/day: 0.25   Types: Cigarettes   Passive exposure: Past  Smokeless Tobacco Never   Counseling given: Not Answered   Continue to not smoke  Outpatient Encounter Medications as of 12/25/2021  Medication Sig   azelastine (ASTELIN) 0.1 % nasal spray 2 sprays each nare twice a day for 7 days then 2 sprays each  nare once daily thereafter   Eluxadoline (VIBERZI) 75 MG TABS    fluticasone (FLONASE) 50 MCG/ACT nasal spray 1 spray each nare twice daily for 7 days then decrease to 1 spray each nare daily   levothyroxine (SYNTHROID) 100 MCG tablet Take 100 mcg by mouth daily before breakfast.   Multiple Vitamin (THERA) TABS Take 1 tablet by mouth daily.   progesterone (PROMETRIUM) 200 MG capsule SMARTSIG:1 Capsule(s) By Mouth Every Evening   spironolactone (ALDACTONE) 100 MG tablet Take 100 mg by mouth daily.   [DISCONTINUED] dicyclomine (BENTYL) 10 MG capsule Take 20 mg by mouth 3 (three) times daily as needed for spasms.   [DISCONTINUED] TALICIA 094-70.9-62 MG CPDR SMARTSIG:4 Capsule(s) By Mouth Every 8 Hours   No facility-administered encounter medications on file as of 12/25/2021.     Review of Systems  Review of Systems  N/a Physical Exam  BP 126/64 (BP Location: Left Arm, Patient Position: Sitting, Cuff Size: Normal)   Pulse (!) 57   Wt 137 lb (62.1 kg)   SpO2 92%   BMI 24.27 kg/m   Wt Readings from Last 5 Encounters:  12/25/21 137 lb (62.1 kg)  11/22/21 137 lb (62.1 kg)  11/13/21 136 lb 11 oz (62 kg)  08/25/21 137 lb (62.1 kg)  06/06/21 133 lb (60.3 kg)    BMI Readings from Last 5 Encounters:  12/25/21 24.27 kg/m  11/22/21 24.27 kg/m  11/13/21 24.21 kg/m  08/25/21 24.27 kg/m  06/06/21 22.83 kg/m     Physical Exam General: Well-appearing, no acute distress Eyes: EOMI, no icterus Neck: Supple, no JVP appreciated Pulmonary: Clear, no work of breathing Cardiovascular: Regular rate and rhythm, no murmur Abdomen: Nondistended, bowel sounds present MSK: No synovitis, joint effusion Neuro: Normal gait, no weakness Psych: Normal mood, full affect   Assessment & Plan:   Chronic cough: Present for months, seemingly onset early 2023.  Associate with postnasal drip.  Denies GERD symptoms.  Does have congestion in chest, unclear if originating chest but given her postnasal  drip symptoms feel like it is originating above the vocal cords.  Recommended sinus rinses, Flonase, azelastine spray.  No improvement.  She thinks albuterol has helped some.  Advised her to use albuterol twice a day to see if this helps with this symptom.  This seems to have helped immensely.  Consider ICS/LABA therapy intensification in the future.  Given samples of Breztri, if helpful will strongly recommend ICS/LABA therapy.  Asthma: Clinical diagnosis based on hyperinflation  on chest x-ray response to symptoms with albuterol.   Return in about 3 months (around 03/27/2022).   Lanier Clam, MD 12/25/2021

## 2022-03-29 ENCOUNTER — Other Ambulatory Visit: Payer: Self-pay | Admitting: Family Medicine

## 2022-03-29 DIAGNOSIS — R1032 Left lower quadrant pain: Secondary | ICD-10-CM

## 2022-03-29 DIAGNOSIS — R102 Pelvic and perineal pain: Secondary | ICD-10-CM

## 2022-03-29 DIAGNOSIS — R1011 Right upper quadrant pain: Secondary | ICD-10-CM

## 2022-03-29 DIAGNOSIS — R1012 Left upper quadrant pain: Secondary | ICD-10-CM

## 2022-04-03 ENCOUNTER — Encounter: Payer: Self-pay | Admitting: Family Medicine

## 2022-04-04 ENCOUNTER — Ambulatory Visit
Admission: RE | Admit: 2022-04-04 | Discharge: 2022-04-04 | Disposition: A | Discharge status: Home / Self Care | Payer: Self-pay | Source: Ambulatory Visit | Attending: Family Medicine | Admitting: Family Medicine

## 2022-04-04 DIAGNOSIS — R1032 Left lower quadrant pain: Secondary | ICD-10-CM

## 2022-04-04 DIAGNOSIS — R1011 Right upper quadrant pain: Secondary | ICD-10-CM

## 2022-04-04 DIAGNOSIS — R102 Pelvic and perineal pain: Secondary | ICD-10-CM

## 2022-04-04 DIAGNOSIS — R1012 Left upper quadrant pain: Secondary | ICD-10-CM

## 2022-04-04 MED ORDER — IOPAMIDOL (ISOVUE-300) INJECTION 61%
100.0000 mL | Freq: Once | INTRAVENOUS | Status: AC | PRN
  Administered 2022-04-04: 100 mL via INTRAVENOUS

## 2022-05-13 IMAGING — CT CT ABD-PELV W/ CM
2 of 5 series · 16 of 46 positions shown, 18 images · IV contrast (omnipaque)
Comparison: 07/28/2020

CLINICAL DATA: Acute abdominal pain, C difficile positivity,
initial encounter

EXAM:
CT ABDOMEN AND PELVIS WITH CONTRAST
TECHNIQUE: Multidetector CT imaging of the abdomen and pelvis was performed
using the standard protocol following bolus administration of
intravenous contrast.
CONTRAST:  80mL OMNIPAQUE IOHEXOL 350 MG/ML SOLN

[Series 2: axial st · axial · 0.74mm/px · z∈[-432,-82]mm · 13 of 82 slices shown, 15 images]
[im 6/82  soft-tissue]
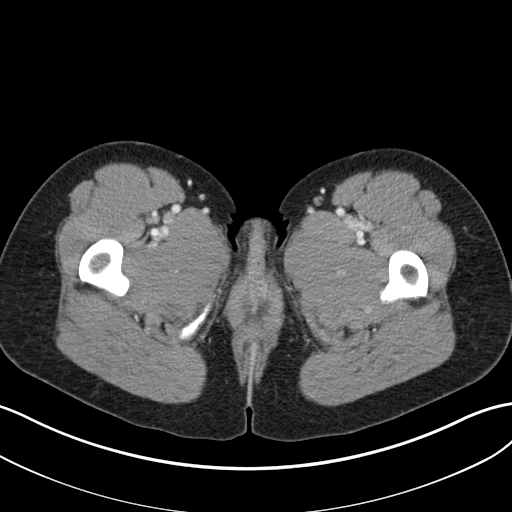
[im 6/82  bone]
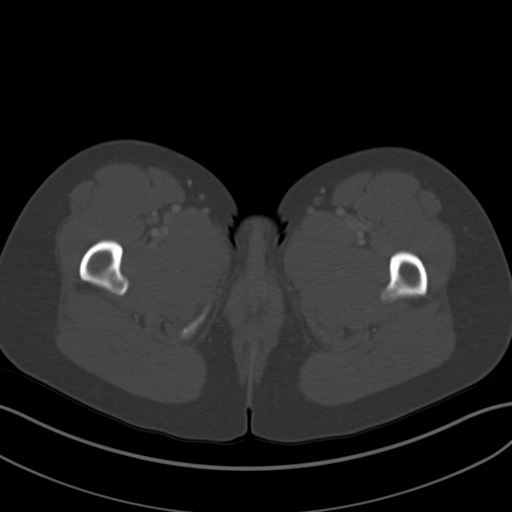
[im 12/82  soft-tissue]
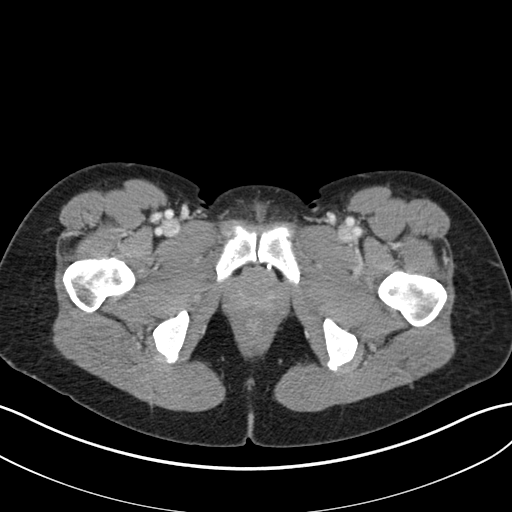
[im 18/82  soft-tissue]
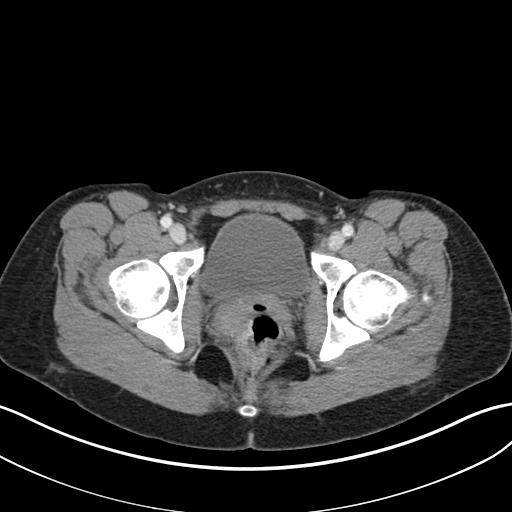
[im 24/82  soft-tissue]
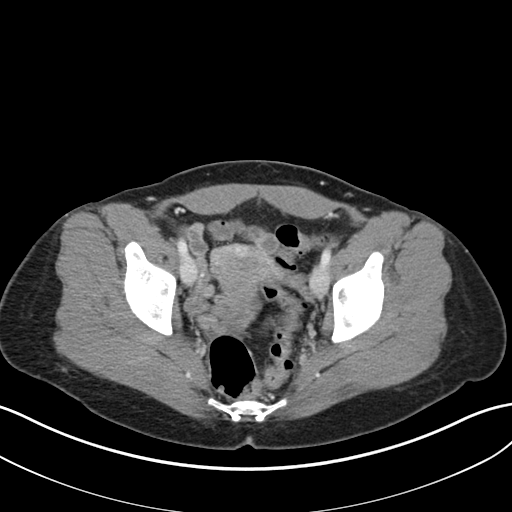
[im 29/82  soft-tissue]
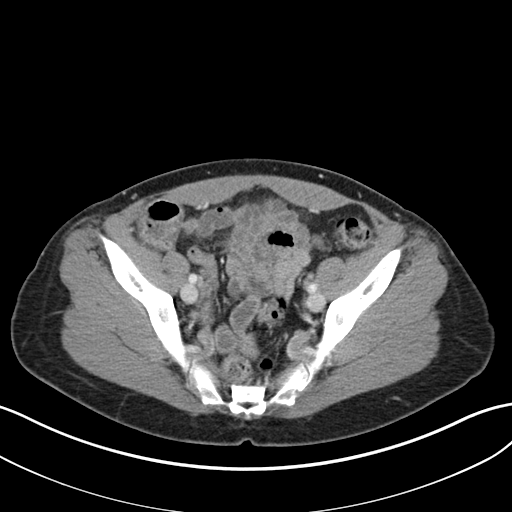
[im 35/82  soft-tissue]
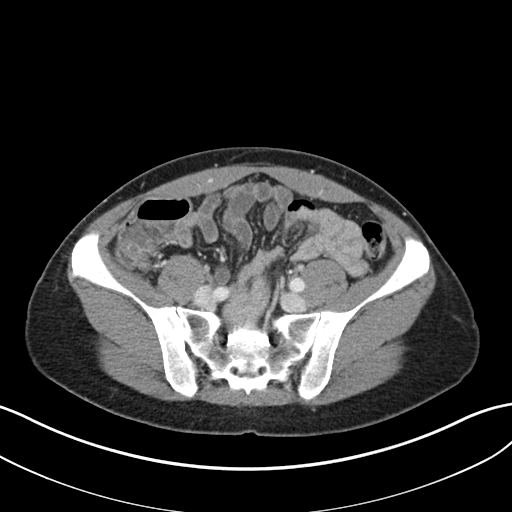
[im 41/82  soft-tissue]
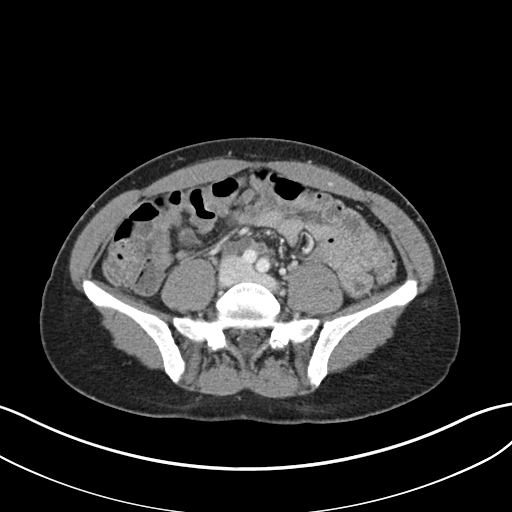
[im 47/82  soft-tissue]
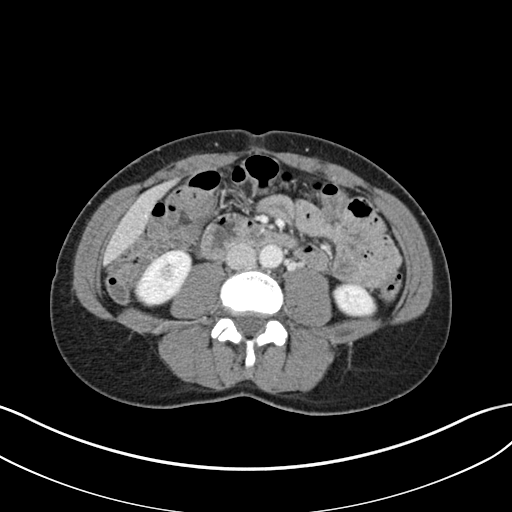
[im 53/82  soft-tissue]
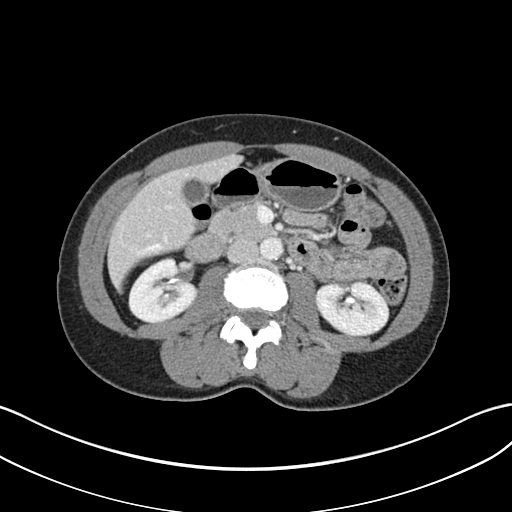
[im 53/82  bone]
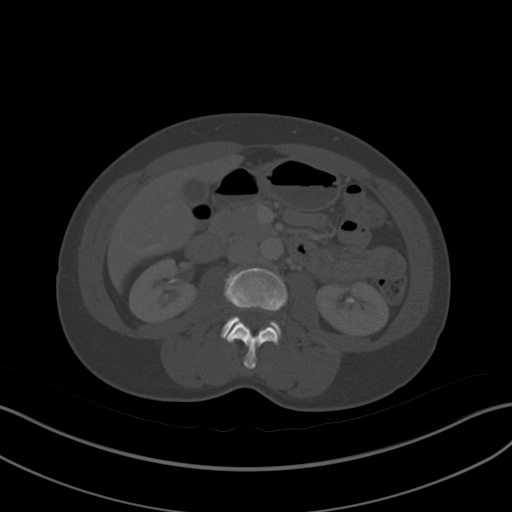
[im 58/82  soft-tissue]
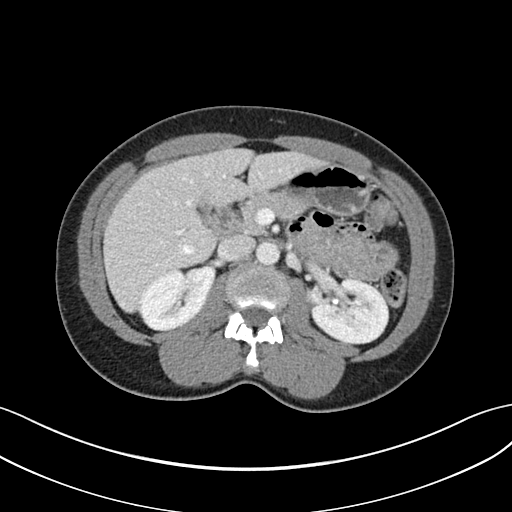
[im 64/82  soft-tissue]
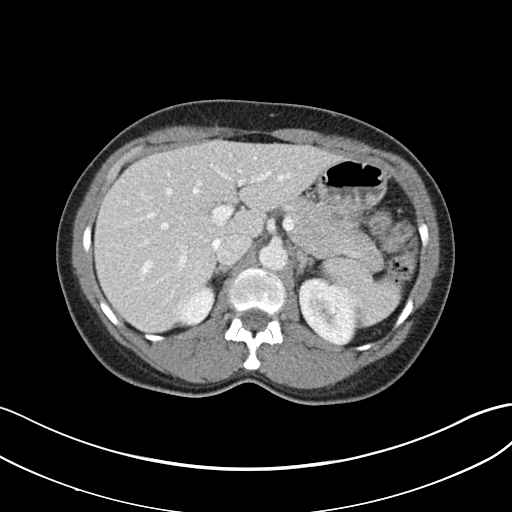
[im 70/82  soft-tissue]
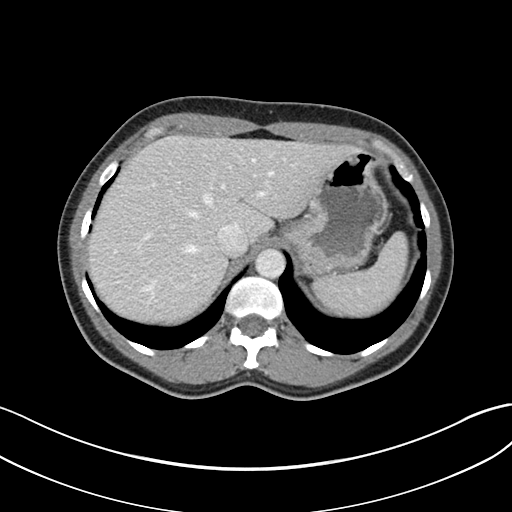
[im 76/82  soft-tissue]
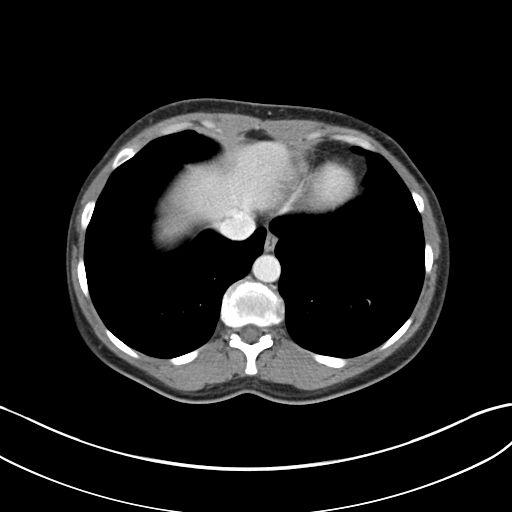

[Series 5: coronal st · coronal · 0.68mm/px · 3 of 150 slices shown]
[im 50/150  soft-tissue]
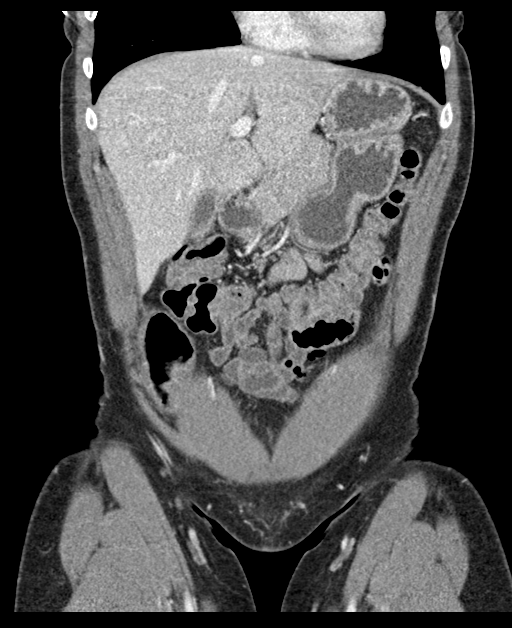
[im 67/150  soft-tissue]
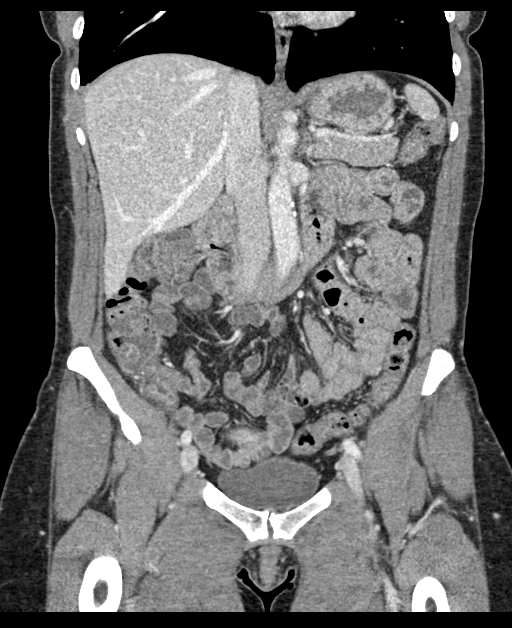
[im 83/150  soft-tissue]
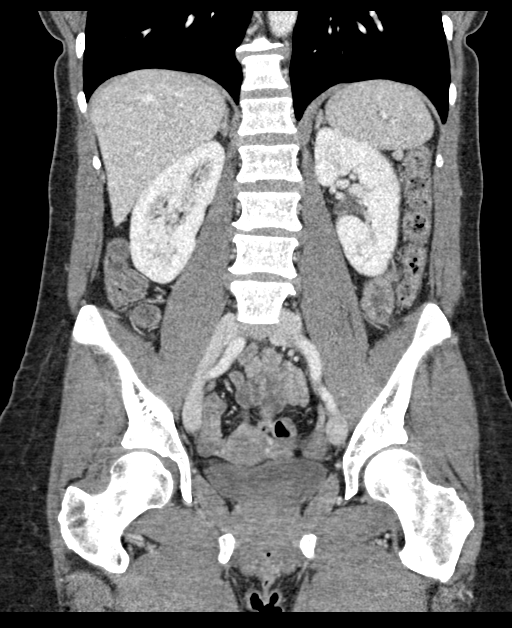

[16 of 46 positions shown; findings below may reference images not displayed]

FINDINGS: Lower chest: No acute abnormality.

Hepatobiliary: No focal liver abnormality is seen. No gallstones,
gallbladder wall thickening, or biliary dilatation.

Pancreas: Unremarkable. No pancreatic ductal dilatation or
surrounding inflammatory changes.

Spleen: Normal in size without focal abnormality.

Adrenals/Urinary Tract: Adrenal glands are within normal limits.
Kidneys demonstrate a normal enhancement pattern bilaterally. No
definitive calculi are seen normal excretion is noted on delayed
images. No obstructive changes are noted. The bladder is partially
distended.

Stomach/Bowel: Scattered diverticular change of the colon is noted
without evidence of diverticulitis. No significant wall thickening
or inflammatory changes of the colon are noted. The appendix is
within normal limits. No inflammatory changes are seen. Small bowel
and stomach appear unremarkable.

Vascular/Lymphatic: Aortic atherosclerosis. No enlarged abdominal or
pelvic lymph nodes.

Reproductive: Uterus and bilateral adnexa are unremarkable.

Other: No abdominal wall hernia or abnormality. No abdominopelvic
ascites.

Musculoskeletal: No acute or significant osseous findings.
IMPRESSION: Diverticulosis without diverticulitis. No significant inflammatory
changes of the colon are noted to correspond with the given clinical
history of C difficile positivity.

No other focal abnormality is noted.

## 2022-06-25 ENCOUNTER — Ambulatory Visit (INDEPENDENT_AMBULATORY_CARE_PROVIDER_SITE_OTHER): Payer: No Typology Code available for payment source | Admitting: Pulmonary Disease

## 2022-06-25 ENCOUNTER — Encounter: Payer: Self-pay | Admitting: Pulmonary Disease

## 2022-06-25 VITALS — BP 100/64 | HR 82 | Temp 99.0°F | Ht 63.0 in | Wt 143.8 lb

## 2022-06-25 DIAGNOSIS — J3089 Other allergic rhinitis: Secondary | ICD-10-CM

## 2022-06-25 DIAGNOSIS — J4540 Moderate persistent asthma, uncomplicated: Secondary | ICD-10-CM

## 2022-06-25 MED ORDER — BREZTRI AEROSPHERE 160-9-4.8 MCG/ACT IN AERO: 2.0000 | INHALATION_SPRAY | Freq: Two times a day (BID) | RESPIRATORY_TRACT | 11 refills | Status: AC

## 2022-06-25 NOTE — Progress Notes (Signed)
@Patient  ID: Stefanie Glass, female    DOB: 1971/01/08, 52 y.o.   MRN: [id number]  Chief Complaint  Patient presents with   Follow-up    Still have a lot sputum    Referring provider: Armando Gang, FNP  HPI:   52 y.o. woman whom we are seeing in follow up for evaluation of chronic cough.    Cough is returned, still with issues.  Markedly improved with albuterol past.  Dyspnea with exertion a lot of sputum production.  A lot of shortness of breath, still very active.  Labs terms of coughing etc.  Use Breztri for couple weeks.  Did think it helped some but not totally.  In the past he was on allergy shots.  She is wondering if this will be helpful in the future.  Referral to allergy as below.  HPI at initial visit: Started about 5 months ago.  No clear trigger.  Denies any significant illness.  No change in location, job etc.  She does have some seasonal allergies.  Seems bit worse in the evenings, maybe mornings.  No position make things better or worse.  No seasonal or environmental factors she can identify to make things better or worse.  No relieving or exacerbating factors.  Cough is usually dry but eventually will produce some sputum.  Some congestion.  She feels it in deep in her throat or chest.  She does endorse postnasal drip symptoms.  Sensation of mucus on the back of her throat.  Not much rhinorrhea.  Some mild congestion.  No chest imaging to review.  PMH: Mitral valve prolapse Surgical history:none Family history: Mother with diabetes, father with dementia Social history: Former smoker, minimal history, lives in [place] / Pulmonary Flowsheets:   ACT:      No data to display           MMRC:     No data to display           Epworth:      No data to display           Tests:   FENO:  No results found for: "NITRICOXIDE"  PFT:     No data to display           WALK:      No data to display            Imaging: No results found.  Lab Results: Personally reviewed CBC    Component Value Date/Time   WBC 5.4 11/13/2021 1540   RBC 4.51 11/13/2021 1540   HGB 13.2 11/13/2021 1540   HCT 40.1 11/13/2021 1540   PLT 254 11/13/2021 1540   MCV 88.9 11/13/2021 1540   MCH 29.3 11/13/2021 1540   MCHC 32.9 11/13/2021 1540   RDW 11.9 11/13/2021 1540   LYMPHSABS 2.7 02/11/2021 1647   MONOABS 0.4 02/11/2021 1647   EOSABS 0.1 02/11/2021 1647   BASOSABS 0.1 02/11/2021 1647    BMET    Component Value Date/Time   NA 140 11/13/2021 1540   K 3.9 11/13/2021 1540   CL 108 11/13/2021 1540   CO2 25 11/13/2021 1540   GLUCOSE 118 (H) 11/13/2021 1540   BUN 23 (H) 11/13/2021 1540   CREATININE 0.73 11/13/2021 1540   CALCIUM 9.2 11/13/2021 1540   GFRNONAA >60 11/13/2021 1540    BNP No results found for: "BNP"  ProBNP No results found for: "PROBNP"  Specialty Problems   None   Allergies  Allergen Reactions   Sulfa Antibiotics Anaphylaxis and Swelling   Sulfacetamide-Prednisolone Other (See Comments)    Immunization History  Administered Date(s) Administered   Hep B, Unspecified 12/18/2019, 06/14/2020, 01/22/2021   Hepatitis B 12/18/2019, 06/14/2020, 01/22/2021   MMR 12/10/1974   Unspecified SARS-COV-2 Vaccination 03/12/2019, 04/09/2019, 01/18/2020, 01/13/2021   Varicella 12/09/1973    Past Medical History:  Diagnosis Date   H. pylori infection    Thyroid disease     Tobacco History: Social History   Tobacco Use  Smoking Status Former   Packs/day: .25   Types: Cigarettes   Passive exposure: Past  Smokeless Tobacco Never   Counseling given: Not Answered   Continue to not smoke  Outpatient Encounter Medications as of 06/25/2022  Medication Sig   azelastine (ASTELIN) 0.1 % nasal spray 2 sprays each nare twice a day for 7 days then 2 sprays each nare once daily thereafter   Budeson-Glycopyrrol-Formoterol (BREZTRI AEROSPHERE) 160-9-4.8 MCG/ACT AERO Inhale 2 puffs  into the lungs in the morning and at bedtime.   Eluxadoline (VIBERZI) 75 MG TABS    fluticasone (FLONASE) 50 MCG/ACT nasal spray 1 spray each nare twice daily for 7 days then decrease to 1 spray each nare daily   Multiple Vitamin (THERA) TABS Take 1 tablet by mouth daily.   progesterone (PROMETRIUM) 200 MG capsule SMARTSIG:1 Capsule(s) By Mouth Every Evening   levothyroxine (SYNTHROID) 100 MCG tablet Take 100 mcg by mouth daily before breakfast.   spironolactone (ALDACTONE) 100 MG tablet Take 100 mg by mouth daily.   No facility-administered encounter medications on file as of 06/25/2022.     Review of Systems  Review of Systems  N/a Physical Exam  BP 100/64 (BP Location: Left Arm, Patient Position: Sitting, Cuff Size: Normal)   Pulse 82   Temp 99 F (37.2 C) (Oral)   Ht 5\' 3"  (1.6 m)   Wt 143 lb 12.8 oz (65.2 kg)   SpO2 95%   BMI 25.47 kg/m   Wt Readings from Last 5 Encounters:  06/25/22 143 lb 12.8 oz (65.2 kg)  12/25/21 137 lb (62.1 kg)  11/22/21 137 lb (62.1 kg)  11/13/21 136 lb 11 oz (62 kg)  08/25/21 137 lb (62.1 kg)    BMI Readings from Last 5 Encounters:  06/25/22 25.47 kg/m  12/25/21 24.27 kg/m  11/22/21 24.27 kg/m  11/13/21 24.21 kg/m  08/25/21 24.27 kg/m     Physical Exam General: Well-appearing, no acute distress Eyes: EOMI, no icterus Neck: Supple, no JVP appreciated Pulmonary: Clear, no work of breathing Cardiovascular: Regular rate and rhythm, no murmur Abdomen: Nondistended, bowel sounds present MSK: No synovitis, joint effusion Neuro: Normal gait, no weakness Psych: Normal mood, full affect   Assessment & Plan:   Chronic cough: Present for months, seemingly onset early 2023.  Associate with postnasal drip.  Denies GERD symptoms.  Does have congestion in chest, unclear if originating chest but given her postnasal drip symptoms feel like it is originating above the vocal cords.  Recommended sinus rinses, Flonase, azelastine spray.  No  improvement.  She thinks albuterol has helped some.  Advised her to use albuterol twice a day to see if this helps with this symptom.  This seems to have helped immensely.  Trial of Breztri at last visit with some mild improvement.  Still producing sputum.  Resume Breztri.  Referral to allergy immunologist that she had similar problems in [place] that resolved with allergy shots.  Consider escalation to Biologics in the future but will  wait for allergy evaluation.  Asthma: Clinical diagnosis based on hyperinflation on chest x-ray response to symptoms with albuterol.   Return in about 2 months (around 08/25/2022).   Karren Burly, MD 06/25/2022

## 2022-06-25 NOTE — Patient Instructions (Signed)
Nice to see you again  Try Breztri 2 puff in the morning and 2 puff in the evening.  Rinse your mouth out with water after every use.  I provided a spacer in case you need  I sent a referral to the allergist to see about resuming allergy shots.  Return to clinic in 2 months or sooner as needed with Dr. Judeth Horn

## 2022-07-11 ENCOUNTER — Encounter: Payer: Self-pay | Admitting: Pulmonary Disease

## 2022-07-12 ENCOUNTER — Ambulatory Visit (INDEPENDENT_AMBULATORY_CARE_PROVIDER_SITE_OTHER): Payer: No Typology Code available for payment source | Admitting: Allergy

## 2022-07-12 ENCOUNTER — Other Ambulatory Visit: Payer: Self-pay

## 2022-07-12 ENCOUNTER — Encounter: Payer: Self-pay | Admitting: Allergy

## 2022-07-12 VITALS — BP 122/78 | HR 67 | Temp 98.2°F | Resp 18 | Ht <= 58 in | Wt 141.1 lb

## 2022-07-12 DIAGNOSIS — H1013 Acute atopic conjunctivitis, bilateral: Secondary | ICD-10-CM

## 2022-07-12 DIAGNOSIS — J302 Other seasonal allergic rhinitis: Secondary | ICD-10-CM | POA: Diagnosis not present

## 2022-07-12 DIAGNOSIS — J3089 Other allergic rhinitis: Secondary | ICD-10-CM | POA: Diagnosis not present

## 2022-07-12 DIAGNOSIS — J4540 Moderate persistent asthma, uncomplicated: Secondary | ICD-10-CM | POA: Diagnosis not present

## 2022-07-12 MED ORDER — IPRATROPIUM BROMIDE 0.06 % NA SOLN: NASAL | 5 refills | Status: DC

## 2022-07-12 MED ORDER — PREDNISONE 20 MG PO TABS: 20.0000 mg | ORAL_TABLET | Freq: Every day | ORAL | 0 refills | Status: AC

## 2022-07-12 MED ORDER — LEVOCETIRIZINE DIHYDROCHLORIDE 5 MG PO TABS: 5.0000 mg | ORAL_TABLET | Freq: Every evening | ORAL | 5 refills | Status: DC

## 2022-07-12 MED ORDER — OLOPATADINE HCL 0.2 % OP SOLN: OPHTHALMIC | 5 refills | Status: AC

## 2022-07-12 NOTE — Patient Instructions (Signed)
-   Testing today showed: ragweed, trees, and outdoor molds - Copy of test results provided.  - Avoidance measures provided. - Stop taking: Cetirizine - Start taking:  Nasal Atrovent 2 sprays each nostril twice a day for nasal drainage control at this time.  Can be used up to 3-4 times a day if needed.   Xyzal 5mg  daily. This is a long-acting antihistamine that may be more effective than Zyrtec and is supposed to be less-sedating.  Pataday 1 drop each eye daily as needed for itchy/watery eyes.  - You can use an extra dose of the antihistamine, if needed, for breakthrough symptoms.  - Consider nasal saline rinses 1-2 times daily to remove allergens from the nasal cavities as well as help with mucous clearance (this is especially helpful to do before the nasal sprays are given) - Consider allergy shots as a means of long-term control. - Allergy shots "re-train" and "reset" the immune system to ignore environmental allergens and decrease the resulting immune response to those allergens (sneezing, itchy watery eyes, runny nose, nasal congestion, etc).    - Allergy shots improve symptoms in 75-85% of patients.  - We can discuss more at the next appointment if the medications are not working for you.  - Lung function testing is normal today - Continue Breztri 2 puffs twice a day as directed by Dr Judeth Horn - Hve access to albuterol inhaler 2 puffs every 4-6 hours as needed for cough/wheeze/shortness of breath/chest tightness.  May use 15-20 minutes prior to activity.   Monitor frequency of use.    Follow-up in 4-6 months or sooner if needed

## 2022-07-12 NOTE — Progress Notes (Signed)
New Patient Note  RE: Stefanie Glass MRN: [id number] DOB: [birth year] Date of Office Visit: 07/12/2022   Primary care provider: Carin Hock, PA  Chief Complaint: Allergies  History of present illness: Stefanie Glass is a 52 y.o. female presenting today for evaluation of allergic rhinitis.  She brings her daughter with her today.  She reports itchy eyes, runny nose with throat clearing mostly.  She has noted that her phlegm is getting thicker, salty and whitish.  Symptoms are seaosnal worse in the spring.  She has discontinued some of her allergy medications as they are not helpful.  She states cetirizine works sometimes but makes her sleepy.  Flonase and astelin did not work for her.  She was using an OTC eye drop that was quite effective, she believes it was olopatadine. She has lived in this area for past 3 years. She was on allergen immunotherapy in the past when living in [place].  She states the immunotherapy was very helpful for her at that time. She has history of asthma and is following with Dr. Judeth Horn with pulmonology.  She is using Breztri twice a day and does find this to be effective for her.  She denies frequent albuterol needs with the [place] on board. No history of eczema or food allergy.  Review of systems: Review of Systems  Constitutional: Negative.   HENT:         See HPI  Eyes: Negative.   Respiratory: Negative.    Cardiovascular: Negative.   Gastrointestinal: Negative.   Musculoskeletal: Negative.   Skin: Negative.   Allergic/Immunologic: Negative.   Neurological: Negative.     All other systems negative unless noted above in HPI  Past medical history: Past Medical History:  Diagnosis Date   Asthma    H. pylori infection    Thyroid disease     Past surgical history: Past Surgical History:  Procedure Laterality Date   ADENOIDECTOMY     TONSILLECTOMY      Family history:  Family History  Problem Relation Age of Onset   Cancer  Mother    Diabetes Mother    Cancer Father    Dementia Father     Social history: Lives in a home without carpeting with gas heating and central cooling.  Cat in the home.  There is no concern for water damage, mildew or roaches in the home.  She is a Catering manager.  She denies a smoking history.   Medication List: Current Outpatient Medications  Medication Sig Dispense Refill   albuterol (VENTOLIN HFA) 108 (90 Base) MCG/ACT inhaler 1-2 PUFFS EVERY FOUR TO SIX HOURS AS NEEDED     Budeson-Glycopyrrol-Formoterol (BREZTRI AEROSPHERE) 160-9-4.8 MCG/ACT AERO Inhale 2 puffs into the lungs in the morning and at bedtime. 1 each 11   ipratropium (ATROVENT) 0.06 % nasal spray 2 sprays each nostril 2 times daily for nasal drainage control 15 mL 5   levocetirizine (XYZAL) 5 MG tablet Take 1 tablet (5 mg total) by mouth every evening. 30 tablet 5   NP THYROID 30 MG tablet Take 30 mg by mouth daily.     Olopatadine HCl (PATADAY) 0.2 % SOLN 1 drop each eye daily as needed for itchy/watery eyes. 2.5 mL 5   progesterone (PROMETRIUM) 200 MG capsule SMARTSIG:1 Capsule(s) By Mouth Every Evening     Testosterone (TESTOPEL) 75 MG PLLT 138 mg every 3 months by implant route.     Eluxadoline (VIBERZI) 75 MG TABS  (Patient not taking: Reported on  07/12/2022)     fluticasone (FLONASE) 50 MCG/ACT nasal spray 1 spray each nare twice daily for 7 days then decrease to 1 spray each nare daily (Patient not taking: Reported on 07/12/2022) 16 g 2   Multiple Vitamin (THERA) TABS Take 1 tablet by mouth daily. (Patient not taking: Reported on 07/12/2022)     predniSONE (DELTASONE) 20 MG tablet Take 1 tablet (20 mg total) by mouth daily with breakfast for 5 days. 5 tablet 0   No current facility-administered medications for this visit.    Known medication allergies: Allergies  Allergen Reactions   Sulfa Antibiotics Anaphylaxis and Swelling   Sulfacetamide-Prednisolone Other (See Comments)     Physical  examination: Blood pressure 122/78, pulse 67, temperature 98.2 F (36.8 C), temperature source Temporal, resp. rate 18, height 4' 4.75" (1.34 m), weight 141 lb 1.6 oz (64 kg), SpO2 97 %.  General: Alert, interactive, in no acute distress. HEENT: PERRLA, TMs pearly gray, turbinates minimally edematous without discharge, post-pharynx non erythematous. Neck: Supple without lymphadenopathy. Lungs: Clear to auscultation without wheezing, rhonchi or rales. {no increased work of breathing. CV: Normal S1, S2 without murmurs. Abdomen: Nondistended, nontender. Skin: Warm and dry, without lesions or rashes. Extremities:  No clubbing, cyanosis or edema. Neuro:   Grossly intact.  Diagnositics/Labs:  Spirometry: FEV1: 2.65 L 159%, FVC: 3.39 L 168%, ratio consistent with nonobstructive pattern.  Demographics were entered correctly.  She very great effort. ACT score 22, indicates good control  Allergy testing:   Airborne Adult Perc - 07/12/22 1431     Time Antigen Placed 1431    Allergen Manufacturer Waynette Buttery    Location Back    Number of Test 59    Panel 1 Select    1. Control-Buffer 50% Glycerol Negative    2. Control-Histamine 1 mg/ml 2+    3. Albumin saline Negative    4. Bahia Negative    5. French Southern Territories Negative    6. Johnson Negative    7. Kentucky Blue Negative    8. Meadow Fescue Negative    9. Perennial Rye Negative    10. Sweet Vernal Negative    11. Timothy Negative    12. Cocklebur Negative    13. Burweed Marshelder Negative    14. Ragweed, short Negative    15. Ragweed, Giant 2+    16. Plantain,  English Negative    17. Lamb's Quarters Negative    18. Sheep Sorrell Negative    19. Rough Pigweed Negative    20. Marsh Elder, Rough Negative    21. Mugwort, Common Negative    22. Ash mix Negative    23. Birch mix Negative    24. Beech American Negative    25. Box, Elder 2+    26. Cedar, red Negative    27. Cottonwood, Guinea-Bissau Negative    28. Elm mix Negative    29. Hickory  Negative    30. Maple mix Negative    31. Oak, Guinea-[place]au mix Negative    32. Pecan Pollen Negative    33. Pine mix Negative    34. Sycamore Eastern Negative    35. Walnut, Black Pollen Negative    36. Alternaria alternata Negative    37. Cladosporium Herbarum Negative    38. Aspergillus mix Negative    39. Penicillium mix Negative    40. Bipolaris sorokiniana (Helminthosporium) 2+    41. Drechslera spicifera (Curvularia) Negative    42. Mucor plumbeus Negative    43. Fusarium moniliforme Negative  44. Aureobasidium pullulans (pullulara) Negative    45. Rhizopus oryzae Negative    46. Botrytis cinera Negative    47. Epicoccum nigrum Negative    48. Phoma betae Negative    49. Candida Albicans Negative    50. Trichophyton mentagrophytes Negative    51. Mite, D Farinae  5,000 AU/ml Negative    52. Mite, D Pteronyssinus  5,000 AU/ml Negative    53. Cat Hair 10,000 BAU/ml Negative    54.  Dog Epithelia Negative    55. Mixed Feathers Negative    56. Horse Epithelia Negative    57. Cockroach, German Negative    58. Mouse Negative    59. Tobacco Leaf Negative             Intradermal - 07/12/22 1501     Time Antigen Placed 1501    Allergen Manufacturer Waynette Buttery    Location Arm    Number of Test 12    Control Negative    French Southern Territories Negative    Johnson Negative    7 Grass Negative    Weed mix Negative    Mold 1 Negative    Mold 2 Negative    Mold 4 Negative    Cat Negative    Dog Negative    Cockroach Negative    Mite mix Negative             Allergy testing results were read and interpreted by provider, documented by clinical staff.   Assessment and plan: Allergic rhinitis with conjunctivitis  - Testing today showed: ragweed, trees, and outdoor molds - Copy of test results provided.  - Avoidance measures provided. - Stop taking: Cetirizine - Start taking:  Nasal Atrovent 2 sprays each nostril twice a day for nasal drainage control at this time.  Can be used up  to 3-4 times a day if needed.   Xyzal 5mg  daily. This is a long-acting antihistamine that may be more effective than Zyrtec and is supposed to be less-sedating.  Pataday 1 drop each eye daily as needed for itchy/watery eyes.  - You can use an extra dose of the antihistamine, if needed, for breakthrough symptoms.  - Consider nasal saline rinses 1-2 times daily to remove allergens from the nasal cavities as well as help with mucous clearance (this is especially helpful to do before the nasal sprays are given) - Consider allergy shots as a means of long-term control. - Allergy shots "re-train" and "reset" the immune system to ignore environmental allergens and decrease the resulting immune response to those allergens (sneezing, itchy watery eyes, runny nose, nasal congestion, etc).    - Allergy shots improve symptoms in 75-85% of patients.  - We can discuss more at the next appointment if the medications are not working for you.  Moderate persistent asthma - Lung function testing is normal today - Continue Breztri 2 puffs twice a day as directed by Dr Judeth Horn - Hve access to albuterol inhaler 2 puffs every 4-6 hours as needed for cough/wheeze/shortness of breath/chest tightness.  May use 15-20 minutes prior to activity.   Monitor frequency of use.    Follow-up in 4-6 months or sooner if needed  I appreciate the opportunity to take part in Jeannifer's care. Please do not hesitate to contact me with questions.  Sincerely,   Margo Aye, MD Allergy/Immunology [facility]

## 2022-07-12 NOTE — Telephone Encounter (Signed)
Mychart message sent by pt:  [facility] (supporting Lesia Sago Hunsucker, MD)Yesterday (7:55 AM)    Good morning Dr. Judeth Horn, I have observed changes in my phlegm, it changed from a clear and fluid consistency to a white and thick consistency. So thick it forms thick "balls". My SpO2 at rest also changed from a 99 to a 97%. I have also started to experience SOB with extortion. I did call and scheduled an appointment with the asthma specialist. Please advise what to do about the sputum    Looked at pt's chart and saw that she was scheduled to see asthma and allergy today 5/16.  Routing to Dr. Judeth Horn for review. Please advise.

## 2022-09-11 ENCOUNTER — Encounter: Payer: Self-pay | Admitting: Pulmonary Disease

## 2022-09-11 ENCOUNTER — Ambulatory Visit (INDEPENDENT_AMBULATORY_CARE_PROVIDER_SITE_OTHER): Payer: No Typology Code available for payment source | Admitting: Pulmonary Disease

## 2022-09-11 VITALS — BP 114/70 | HR 78 | Ht 66.0 in | Wt 146.0 lb

## 2022-09-11 DIAGNOSIS — R053 Chronic cough: Secondary | ICD-10-CM

## 2022-09-11 NOTE — Patient Instructions (Signed)
Nice to see you again  No changes to medication  Glad the medicines the allergist recommended are helping things.  Return to clinic in 6 months or sooner if needed with Dr. Judeth Horn

## 2022-09-11 NOTE — Progress Notes (Signed)
@Patient  ID: Stefanie Glass, female    DOB: 24-Jan-1971, 52 y.o.   MRN: [id number]  Chief Complaint  Patient presents with   Follow-up    2 mo f/u for asthma. States she established with allergy and asthma and has been doing well so far.     Referring provider: Carin Hock, PA  HPI:   52 y.o. woman whom we are seeing in follow up for evaluation of chronic cough.  Recent allergy and immunology note reviewed.  Referred to allergy and immunology at last visit.  Started with antihistamines, ipratropium nasal spray.  Nasal congestion postnasal drip improved.  Cough a bit improved as well.  Not significant shortness of breath.  Continues on [place].  Overall things seem improved.  Discussed expectant management and consideration of escalation of therapies in the future.  Consider Biologics.  Versus initiation of allergy shots at the discretion of her allergist.  HPI at initial visit: Started about 5 months ago.  No clear trigger.  Denies any significant illness.  No change in location, job etc.  She does have some seasonal allergies.  Seems bit worse in the evenings, maybe mornings.  No position make things better or worse.  No seasonal or environmental factors she can identify to make things better or worse.  No relieving or exacerbating factors.  Cough is usually dry but eventually will produce some sputum.  Some congestion.  She feels it in deep in her throat or chest.  She does endorse postnasal drip symptoms.  Sensation of mucus on the back of her throat.  Not much rhinorrhea.  Some mild congestion.  No chest imaging to review.  PMH: Mitral valve prolapse Surgical history:none Family history: Mother with diabetes, father with dementia Social history: Former smoker, minimal history, lives in [place] / Pulmonary Flowsheets:   ACT:  Asthma Control Test ACT Total Score  07/12/2022  1:00 PM 22    MMRC:     No data to display          [facility]:      No  data to display          Tests:   FENO:  No results found for: "NITRICOXIDE"  PFT:     No data to display          WALK:      No data to display          Imaging: No results found.  Lab Results: Personally reviewed CBC    Component Value Date/Time   WBC 5.4 11/13/2021 1540   RBC 4.51 11/13/2021 1540   HGB 13.2 11/13/2021 1540   HCT 40.1 11/13/2021 1540   PLT 254 11/13/2021 1540   MCV 88.9 11/13/2021 1540   MCH 29.3 11/13/2021 1540   MCHC 32.9 11/13/2021 1540   RDW 11.9 11/13/2021 1540   LYMPHSABS 2.7 02/11/2021 1647   MONOABS 0.4 02/11/2021 1647   EOSABS 0.1 02/11/2021 1647   BASOSABS 0.1 02/11/2021 1647    BMET    Component Value Date/Time   NA 140 11/13/2021 1540   K 3.9 11/13/2021 1540   CL 108 11/13/2021 1540   CO2 25 11/13/2021 1540   GLUCOSE 118 (H) 11/13/2021 1540   BUN 23 (H) 11/13/2021 1540   CREATININE 0.73 11/13/2021 1540   CALCIUM 9.2 11/13/2021 1540   GFRNONAA >60 11/13/2021 1540    BNP No results found for: "BNP"  ProBNP No results found for: "PROBNP"  Specialty Problems   None  Allergies  Allergen Reactions   Sulfa Antibiotics Anaphylaxis and Swelling   Sulfacetamide-Prednisolone Other (See Comments)    Immunization History  Administered Date(s) Administered   Hep B, Unspecified 12/18/2019, 06/14/2020, 01/22/2021   Hepatitis B 12/18/2019, 06/14/2020, 01/22/2021   MMR [birth year]/10/1974   Unspecified SARS-COV-2 Vaccination 03/12/2019, 04/09/2019, 01/18/2020, 01/13/2021   Varicella 12/09/1973    Past Medical History:  Diagnosis Date   Asthma    H. pylori infection    Thyroid disease     Tobacco History: Social History   Tobacco Use  Smoking Status Former   Current packs/day: 0.25   Types: Cigarettes   Passive exposure: Past  Smokeless Tobacco Never   Counseling given: Not Answered   Continue to not smoke  Outpatient Encounter Medications as of 09/11/2022  Medication Sig    Budeson-Glycopyrrol-Formoterol (BREZTRI AEROSPHERE) 160-9-4.8 MCG/ACT AERO Inhale 2 puffs into the lungs in the morning and at bedtime.   ipratropium (ATROVENT) 0.06 % nasal spray 2 sprays each nostril 2 times daily for nasal drainage control   levocetirizine (XYZAL) 5 MG tablet Take 1 tablet (5 mg total) by mouth every evening.   NP THYROID 30 MG tablet Take 30 mg by mouth daily.   Olopatadine HCl (PATADAY) 0.2 % SOLN 1 drop each eye daily as needed for itchy/watery eyes.   Omega-3 Fatty Acids (FISH OIL) 1200 MG CAPS Take 1,200 mg by mouth daily.   progesterone (PROMETRIUM) 200 MG capsule SMARTSIG:1 Capsule(s) By Mouth Every Evening   Testosterone (TESTOPEL) 75 MG PLLT 138 mg every 3 months by implant route.   [DISCONTINUED] albuterol (VENTOLIN HFA) 108 (90 Base) MCG/ACT inhaler 1-2 PUFFS EVERY FOUR TO SIX HOURS AS NEEDED   [DISCONTINUED] Eluxadoline (VIBERZI) 75 MG TABS  (Patient not taking: Reported on 07/12/2022)   [DISCONTINUED] fluticasone (FLONASE) 50 MCG/ACT nasal spray 1 spray each nare twice daily for 7 days then decrease to 1 spray each nare daily (Patient not taking: Reported on 07/12/2022)   [DISCONTINUED] Multiple Vitamin (THERA) TABS Take 1 tablet by mouth daily. (Patient not taking: Reported on 07/12/2022)   No facility-administered encounter medications on file as of 09/11/2022.     Review of Systems  Review of Systems  N/a Physical Exam  BP 114/70   Pulse 78   Ht 5\' 6"  (1.676 m)   Wt 146 lb (66.2 kg)   SpO2 97% Comment: on RA  BMI 23.57 kg/m   Wt Readings from Last 5 Encounters:  09/11/22 146 lb (66.2 kg)  07/12/22 141 lb 1.6 oz (64 kg)  06/25/22 143 lb 12.8 oz (65.2 kg)  12/25/21 137 lb (62.1 kg)  11/22/21 137 lb (62.1 kg)    BMI Readings from Last 5 Encounters:  09/11/22 23.57 kg/m  07/12/22 35.65 kg/m  06/25/22 25.47 kg/m  12/25/21 24.27 kg/m  11/22/21 24.27 kg/m     Physical Exam General: Well-appearing, no acute distress Eyes: EOMI, no  icterus Neck: Supple, no JVP appreciated Pulmonary: Clear, no work of breathing Cardiovascular: Regular rate and rhythm, no murmur Abdomen: Nondistended, bowel sounds present MSK: No synovitis, joint effusion Neuro: Normal gait, no weakness Psych: Normal mood, full affect   Assessment & Plan:   Chronic cough: Present for months, seemingly onset early 2023.  Associate with postnasal drip.  Denies GERD symptoms.  Does have congestion in chest, unclear if originating chest but given her postnasal drip symptoms feel like it is originating above the vocal cords.  Recommended sinus rinses, Flonase, azelastine spray.  No improvement.  She  thinks albuterol has helped some.  Advised her to use albuterol twice a day to see if this helps with this symptom.  This seems to have helped immensely.  Trial of Breztri in past with some mild improvement.  Still producing sputum.  Resumed Breztri.  Referral to allergy immunologist that she had similar problems in [place] that resolved with allergy shots.  With initial improvement intranasal sprays, ipratropium, as well as antihistamines.  Consider escalation to Biologics in the future but will wait for allergy ongoing evaluation.  Asthma: Clinical diagnosis based on hyperinflation on chest x-ray response to symptoms with albuterol.   Return in about 6 months (around 03/14/2023).   Karren Burly, MD 09/11/2022

## 2022-11-01 ENCOUNTER — Other Ambulatory Visit: Payer: Self-pay | Admitting: Allergy

## 2022-12-03 ENCOUNTER — Encounter (HOSPITAL_BASED_OUTPATIENT_CLINIC_OR_DEPARTMENT_OTHER): Payer: Self-pay

## 2022-12-03 ENCOUNTER — Emergency Department (HOSPITAL_BASED_OUTPATIENT_CLINIC_OR_DEPARTMENT_OTHER)
Admission: EM | Admit: 2022-12-03 | Discharge: 2022-12-03 | Discharge status: Against Medical Advice | Payer: No Typology Code available for payment source | Attending: Emergency Medicine | Admitting: Emergency Medicine

## 2022-12-03 DIAGNOSIS — R1031 Right lower quadrant pain: Secondary | ICD-10-CM | POA: Insufficient documentation

## 2022-12-03 DIAGNOSIS — Z5321 Procedure and treatment not carried out due to patient leaving prior to being seen by health care provider: Secondary | ICD-10-CM | POA: Diagnosis not present

## 2022-12-03 DIAGNOSIS — R1012 Left upper quadrant pain: Secondary | ICD-10-CM | POA: Diagnosis present

## 2022-12-03 LAB — CBC
HCT: 38.4 % (ref 36.0–46.0)
Hemoglobin: 13 g/dL (ref 12.0–15.0)
MCH: 29.5 pg (ref 26.0–34.0)
MCHC: 33.9 g/dL (ref 30.0–36.0)
MCV: 87.1 fL (ref 80.0–100.0)
Platelets: 254 10*3/uL (ref 150–400)
RBC: 4.41 MIL/uL (ref 3.87–5.11)
RDW: 12.4 % (ref 11.5–15.5)
WBC: 4.4 10*3/uL (ref 4.0–10.5)
nRBC: 0 % (ref 0.0–0.2)

## 2022-12-03 LAB — COMPREHENSIVE METABOLIC PANEL
ALT: 28 U/L (ref 0–44)
AST: 28 U/L (ref 15–41)
Albumin: 4.4 g/dL (ref 3.5–5.0)
Alkaline Phosphatase: 35 U/L — ABNORMAL LOW (ref 38–126)
Anion gap: 9 (ref 5–15)
BUN: 20 mg/dL (ref 6–20)
CO2: 24 mmol/L (ref 22–32)
Calcium: 9.2 mg/dL (ref 8.9–10.3)
Chloride: 103 mmol/L (ref 98–111)
Creatinine, Ser: 0.77 mg/dL (ref 0.44–1.00)
GFR, Estimated: >60 mL/min (ref >60)
Glucose, Bld: 105 mg/dL — ABNORMAL HIGH (ref 70–99)
Potassium: 3.8 mmol/L (ref 3.5–5.1)
Sodium: 136 mmol/L (ref 135–145)
Total Bilirubin: 0.3 mg/dL (ref 0.3–1.2)
Total Protein: 6.4 g/dL — ABNORMAL LOW (ref 6.5–8.1)

## 2022-12-03 LAB — LIPASE, BLOOD: Lipase: 19 U/L (ref 11–51)

## 2022-12-03 NOTE — ED Triage Notes (Addendum)
Pt states she was biking & "felt like something ripped inside of me all of a sudden & now I have severe abd pain." States she is belching "a lot, & my abd is distended."   Hx ectopic pregnancy in 2003, states pain is "the same, if not worse."    Pt states pain is worse in ULQ & LRQ

## 2022-12-06 ENCOUNTER — Other Ambulatory Visit (HOSPITAL_COMMUNITY): Payer: Self-pay | Admitting: Family Medicine

## 2022-12-06 DIAGNOSIS — R1011 Right upper quadrant pain: Secondary | ICD-10-CM

## 2022-12-07 ENCOUNTER — Ambulatory Visit (HOSPITAL_BASED_OUTPATIENT_CLINIC_OR_DEPARTMENT_OTHER)
Admission: RE | Admit: 2022-12-07 | Discharge: 2022-12-07 | Disposition: A | Discharge status: Home / Self Care | Payer: No Typology Code available for payment source | Source: Ambulatory Visit | Attending: Family Medicine | Admitting: Family Medicine

## 2022-12-07 DIAGNOSIS — R1011 Right upper quadrant pain: Secondary | ICD-10-CM | POA: Diagnosis present

## 2023-02-03 ENCOUNTER — Other Ambulatory Visit: Payer: Self-pay | Admitting: Allergy

## 2023-09-06 ENCOUNTER — Other Ambulatory Visit: Payer: Self-pay | Admitting: *Deleted
